# Patient Record
Sex: Female | Born: 1935 | Race: Black or African American | Hispanic: No | Smoking: Never smoker
Health system: Southern US, Community
[De-identification: ages and names within clinical notes are randomized; demographics above are authoritative.]

## PROBLEM LIST (undated history)

## (undated) DIAGNOSIS — M1990 Unspecified osteoarthritis, unspecified site: Secondary | ICD-10-CM

## (undated) DIAGNOSIS — I10 Essential (primary) hypertension: Secondary | ICD-10-CM

## (undated) DIAGNOSIS — F329 Major depressive disorder, single episode, unspecified: Secondary | ICD-10-CM

## (undated) DIAGNOSIS — F419 Anxiety disorder, unspecified: Secondary | ICD-10-CM

## (undated) DIAGNOSIS — F32A Depression, unspecified: Secondary | ICD-10-CM

## (undated) HISTORY — DX: Unspecified osteoarthritis, unspecified site: M19.90

## (undated) HISTORY — DX: Essential (primary) hypertension: I10

## (undated) HISTORY — DX: Major depressive disorder, single episode, unspecified: F32.9

## (undated) HISTORY — DX: Depression, unspecified: F32.A

## (undated) HISTORY — DX: Anxiety disorder, unspecified: F41.9

---

## 1999-08-29 ENCOUNTER — Other Ambulatory Visit: Admission: RE | Admit: 1999-08-29 | Discharge: 1999-08-29 | Payer: Self-pay | Admitting: Family Medicine

## 2001-06-18 ENCOUNTER — Ambulatory Visit (HOSPITAL_COMMUNITY): Admission: RE | Admit: 2001-06-18 | Discharge: 2001-06-18 | Payer: Self-pay | Admitting: Family Medicine

## 2001-06-18 ENCOUNTER — Encounter: Payer: Self-pay | Admitting: Family Medicine

## 2002-06-29 ENCOUNTER — Ambulatory Visit (HOSPITAL_COMMUNITY): Admission: RE | Admit: 2002-06-29 | Discharge: 2002-06-29 | Payer: Self-pay | Admitting: Family Medicine

## 2002-06-29 ENCOUNTER — Encounter: Payer: Self-pay | Admitting: Family Medicine

## 2003-08-16 ENCOUNTER — Encounter (HOSPITAL_COMMUNITY): Admission: RE | Admit: 2003-08-16 | Discharge: 2003-09-15 | Payer: Self-pay | Admitting: Family Medicine

## 2005-12-05 ENCOUNTER — Emergency Department (HOSPITAL_COMMUNITY): Admission: EM | Admit: 2005-12-05 | Discharge: 2005-12-05 | Payer: Self-pay | Admitting: Emergency Medicine

## 2006-12-08 ENCOUNTER — Ambulatory Visit (HOSPITAL_COMMUNITY): Admission: RE | Admit: 2006-12-08 | Discharge: 2006-12-08 | Payer: Self-pay | Admitting: Family Medicine

## 2009-04-04 ENCOUNTER — Encounter: Admission: RE | Admit: 2009-04-04 | Discharge: 2009-04-04 | Payer: Self-pay | Admitting: General Surgery

## 2009-04-09 ENCOUNTER — Ambulatory Visit (HOSPITAL_BASED_OUTPATIENT_CLINIC_OR_DEPARTMENT_OTHER): Admission: RE | Admit: 2009-04-09 | Discharge: 2009-04-09 | Payer: Self-pay | Admitting: General Surgery

## 2010-05-19 ENCOUNTER — Encounter: Payer: Self-pay | Admitting: Family Medicine

## 2010-07-30 LAB — CBC
HCT: 38.2 % (ref 36.0–46.0)
Hemoglobin: 12.9 g/dL (ref 12.0–15.0)
MCV: 94.8 fL (ref 78.0–100.0)
RBC: 4.03 MIL/uL (ref 3.87–5.11)
WBC: 10.6 10*3/uL — ABNORMAL HIGH (ref 4.0–10.5)

## 2010-07-30 LAB — DIFFERENTIAL
Eosinophils Absolute: 0.6 10*3/uL (ref 0.0–0.7)
Eosinophils Relative: 6 % — ABNORMAL HIGH (ref 0–5)
Lymphocytes Relative: 34 % (ref 12–46)
Lymphs Abs: 3.6 10*3/uL (ref 0.7–4.0)
Monocytes Relative: 7 % (ref 3–12)

## 2010-07-30 LAB — BASIC METABOLIC PANEL
BUN: 9 mg/dL (ref 6–23)
CO2: 32 mEq/L (ref 19–32)
Calcium: 9.3 mg/dL (ref 8.4–10.5)
Chloride: 104 mEq/L (ref 96–112)
Creatinine, Ser: 1.03 mg/dL (ref 0.4–1.2)
GFR calc Af Amer: >60 mL/min (ref >60)
GFR calc non Af Amer: 53 mL/min — ABNORMAL LOW (ref >60)
Glucose, Bld: 94 mg/dL (ref 70–99)
Potassium: 3.3 mEq/L — ABNORMAL LOW (ref 3.5–5.1)
Sodium: 142 mEq/L (ref 135–145)

## 2010-07-30 LAB — PROTIME-INR: Prothrombin Time: 13.2 seconds (ref 11.6–15.2)

## 2011-06-02 DIAGNOSIS — I10 Essential (primary) hypertension: Secondary | ICD-10-CM | POA: Diagnosis not present

## 2011-06-02 DIAGNOSIS — E781 Pure hyperglyceridemia: Secondary | ICD-10-CM | POA: Diagnosis not present

## 2011-06-18 DIAGNOSIS — F332 Major depressive disorder, recurrent severe without psychotic features: Secondary | ICD-10-CM | POA: Diagnosis not present

## 2011-09-29 DIAGNOSIS — N318 Other neuromuscular dysfunction of bladder: Secondary | ICD-10-CM | POA: Diagnosis not present

## 2011-09-29 DIAGNOSIS — I10 Essential (primary) hypertension: Secondary | ICD-10-CM | POA: Diagnosis not present

## 2011-09-29 DIAGNOSIS — F29 Unspecified psychosis not due to a substance or known physiological condition: Secondary | ICD-10-CM | POA: Diagnosis not present

## 2012-02-02 DIAGNOSIS — Z23 Encounter for immunization: Secondary | ICD-10-CM | POA: Diagnosis not present

## 2012-02-02 DIAGNOSIS — F312 Bipolar disorder, current episode manic severe with psychotic features: Secondary | ICD-10-CM | POA: Diagnosis not present

## 2012-02-02 DIAGNOSIS — I10 Essential (primary) hypertension: Secondary | ICD-10-CM | POA: Diagnosis not present

## 2012-04-19 DIAGNOSIS — F332 Major depressive disorder, recurrent severe without psychotic features: Secondary | ICD-10-CM | POA: Diagnosis not present

## 2012-05-03 DIAGNOSIS — F29 Unspecified psychosis not due to a substance or known physiological condition: Secondary | ICD-10-CM | POA: Diagnosis not present

## 2012-05-03 DIAGNOSIS — I10 Essential (primary) hypertension: Secondary | ICD-10-CM | POA: Diagnosis not present

## 2012-05-03 DIAGNOSIS — F329 Major depressive disorder, single episode, unspecified: Secondary | ICD-10-CM | POA: Diagnosis not present

## 2012-05-03 DIAGNOSIS — F411 Generalized anxiety disorder: Secondary | ICD-10-CM | POA: Diagnosis not present

## 2012-06-15 DIAGNOSIS — F332 Major depressive disorder, recurrent severe without psychotic features: Secondary | ICD-10-CM | POA: Diagnosis not present

## 2012-09-07 DIAGNOSIS — F332 Major depressive disorder, recurrent severe without psychotic features: Secondary | ICD-10-CM | POA: Diagnosis not present

## 2012-09-17 DIAGNOSIS — R21 Rash and other nonspecific skin eruption: Secondary | ICD-10-CM | POA: Diagnosis not present

## 2012-09-17 DIAGNOSIS — I10 Essential (primary) hypertension: Secondary | ICD-10-CM | POA: Diagnosis not present

## 2012-09-17 DIAGNOSIS — E7800 Pure hypercholesterolemia, unspecified: Secondary | ICD-10-CM | POA: Diagnosis not present

## 2012-10-01 ENCOUNTER — Ambulatory Visit (HOSPITAL_COMMUNITY)
Admission: RE | Admit: 2012-10-01 | Discharge: 2012-10-01 | Disposition: A | Discharge status: Home / Self Care | Payer: Medicare Other | Source: Ambulatory Visit | Attending: Family Medicine | Admitting: Family Medicine

## 2012-10-01 ENCOUNTER — Other Ambulatory Visit (HOSPITAL_COMMUNITY): Payer: Self-pay | Admitting: Family Medicine

## 2012-10-01 DIAGNOSIS — M79609 Pain in unspecified limb: Secondary | ICD-10-CM | POA: Diagnosis not present

## 2012-10-01 DIAGNOSIS — M25559 Pain in unspecified hip: Secondary | ICD-10-CM | POA: Diagnosis not present

## 2012-10-01 DIAGNOSIS — S79919A Unspecified injury of unspecified hip, initial encounter: Secondary | ICD-10-CM | POA: Diagnosis not present

## 2012-10-01 DIAGNOSIS — T148XXA Other injury of unspecified body region, initial encounter: Secondary | ICD-10-CM

## 2012-10-01 DIAGNOSIS — S79929A Unspecified injury of unspecified thigh, initial encounter: Secondary | ICD-10-CM | POA: Diagnosis not present

## 2012-12-20 DIAGNOSIS — I10 Essential (primary) hypertension: Secondary | ICD-10-CM | POA: Diagnosis not present

## 2013-02-22 DIAGNOSIS — F339 Major depressive disorder, recurrent, unspecified: Secondary | ICD-10-CM | POA: Diagnosis not present

## 2013-04-25 DIAGNOSIS — I10 Essential (primary) hypertension: Secondary | ICD-10-CM | POA: Diagnosis not present

## 2013-04-25 DIAGNOSIS — F323 Major depressive disorder, single episode, severe with psychotic features: Secondary | ICD-10-CM | POA: Diagnosis not present

## 2013-05-17 DIAGNOSIS — F339 Major depressive disorder, recurrent, unspecified: Secondary | ICD-10-CM | POA: Diagnosis not present

## 2013-05-27 DIAGNOSIS — E119 Type 2 diabetes mellitus without complications: Secondary | ICD-10-CM | POA: Diagnosis not present

## 2013-05-27 DIAGNOSIS — I10 Essential (primary) hypertension: Secondary | ICD-10-CM | POA: Diagnosis not present

## 2013-05-27 DIAGNOSIS — E7800 Pure hypercholesterolemia, unspecified: Secondary | ICD-10-CM | POA: Diagnosis not present

## 2013-08-08 DIAGNOSIS — F339 Major depressive disorder, recurrent, unspecified: Secondary | ICD-10-CM | POA: Diagnosis not present

## 2013-08-09 DIAGNOSIS — H43399 Other vitreous opacities, unspecified eye: Secondary | ICD-10-CM | POA: Diagnosis not present

## 2013-08-09 DIAGNOSIS — H2510 Age-related nuclear cataract, unspecified eye: Secondary | ICD-10-CM | POA: Diagnosis not present

## 2013-08-23 DIAGNOSIS — H2589 Other age-related cataract: Secondary | ICD-10-CM | POA: Diagnosis not present

## 2013-08-29 DIAGNOSIS — H2589 Other age-related cataract: Secondary | ICD-10-CM | POA: Diagnosis not present

## 2013-08-29 DIAGNOSIS — H2510 Age-related nuclear cataract, unspecified eye: Secondary | ICD-10-CM | POA: Diagnosis not present

## 2013-08-29 DIAGNOSIS — H269 Unspecified cataract: Secondary | ICD-10-CM | POA: Diagnosis not present

## 2013-09-12 DIAGNOSIS — H2589 Other age-related cataract: Secondary | ICD-10-CM | POA: Diagnosis not present

## 2013-09-12 DIAGNOSIS — H2510 Age-related nuclear cataract, unspecified eye: Secondary | ICD-10-CM | POA: Diagnosis not present

## 2013-09-12 DIAGNOSIS — H269 Unspecified cataract: Secondary | ICD-10-CM | POA: Diagnosis not present

## 2013-09-27 DIAGNOSIS — F323 Major depressive disorder, single episode, severe with psychotic features: Secondary | ICD-10-CM | POA: Diagnosis not present

## 2013-09-27 DIAGNOSIS — I10 Essential (primary) hypertension: Secondary | ICD-10-CM | POA: Diagnosis not present

## 2013-09-27 DIAGNOSIS — F32A Depression, unspecified: Secondary | ICD-10-CM | POA: Diagnosis not present

## 2013-09-27 DIAGNOSIS — B1920 Unspecified viral hepatitis C without hepatic coma: Secondary | ICD-10-CM | POA: Diagnosis not present

## 2013-09-27 DIAGNOSIS — E7800 Pure hypercholesterolemia, unspecified: Secondary | ICD-10-CM | POA: Diagnosis not present

## 2013-10-31 ENCOUNTER — Other Ambulatory Visit: Payer: Self-pay | Admitting: Nurse Practitioner

## 2013-10-31 DIAGNOSIS — C220 Liver cell carcinoma: Secondary | ICD-10-CM

## 2013-10-31 DIAGNOSIS — B182 Chronic viral hepatitis C: Secondary | ICD-10-CM | POA: Diagnosis not present

## 2013-11-01 DIAGNOSIS — F339 Major depressive disorder, recurrent, unspecified: Secondary | ICD-10-CM | POA: Diagnosis not present

## 2013-11-03 DIAGNOSIS — B182 Chronic viral hepatitis C: Secondary | ICD-10-CM | POA: Diagnosis not present

## 2013-11-09 ENCOUNTER — Other Ambulatory Visit: Payer: Medicare Other

## 2013-11-11 ENCOUNTER — Ambulatory Visit
Admission: RE | Admit: 2013-11-11 | Discharge: 2013-11-11 | Disposition: A | Discharge status: Home / Self Care | Payer: Medicare Other | Source: Ambulatory Visit | Attending: Nurse Practitioner | Admitting: Nurse Practitioner

## 2013-11-11 DIAGNOSIS — C220 Liver cell carcinoma: Secondary | ICD-10-CM

## 2013-11-11 DIAGNOSIS — N281 Cyst of kidney, acquired: Secondary | ICD-10-CM | POA: Diagnosis not present

## 2013-11-11 DIAGNOSIS — K8020 Calculus of gallbladder without cholecystitis without obstruction: Secondary | ICD-10-CM | POA: Diagnosis not present

## 2013-12-07 DIAGNOSIS — F29 Unspecified psychosis not due to a substance or known physiological condition: Secondary | ICD-10-CM | POA: Diagnosis not present

## 2013-12-07 DIAGNOSIS — I10 Essential (primary) hypertension: Secondary | ICD-10-CM | POA: Diagnosis not present

## 2013-12-07 DIAGNOSIS — B1920 Unspecified viral hepatitis C without hepatic coma: Secondary | ICD-10-CM | POA: Diagnosis not present

## 2013-12-28 DIAGNOSIS — R109 Unspecified abdominal pain: Secondary | ICD-10-CM | POA: Diagnosis not present

## 2014-02-15 IMAGING — CR DG FEMUR 2V*L*
4 series · 4 of 4 positions shown · non-contrast
Comparison: None.

CLINICAL DATA: Left thigh pain for 2 weeks.  Denies injury.

LEFT FEMUR - 2 VIEW

[view not recorded (1 of 4)]
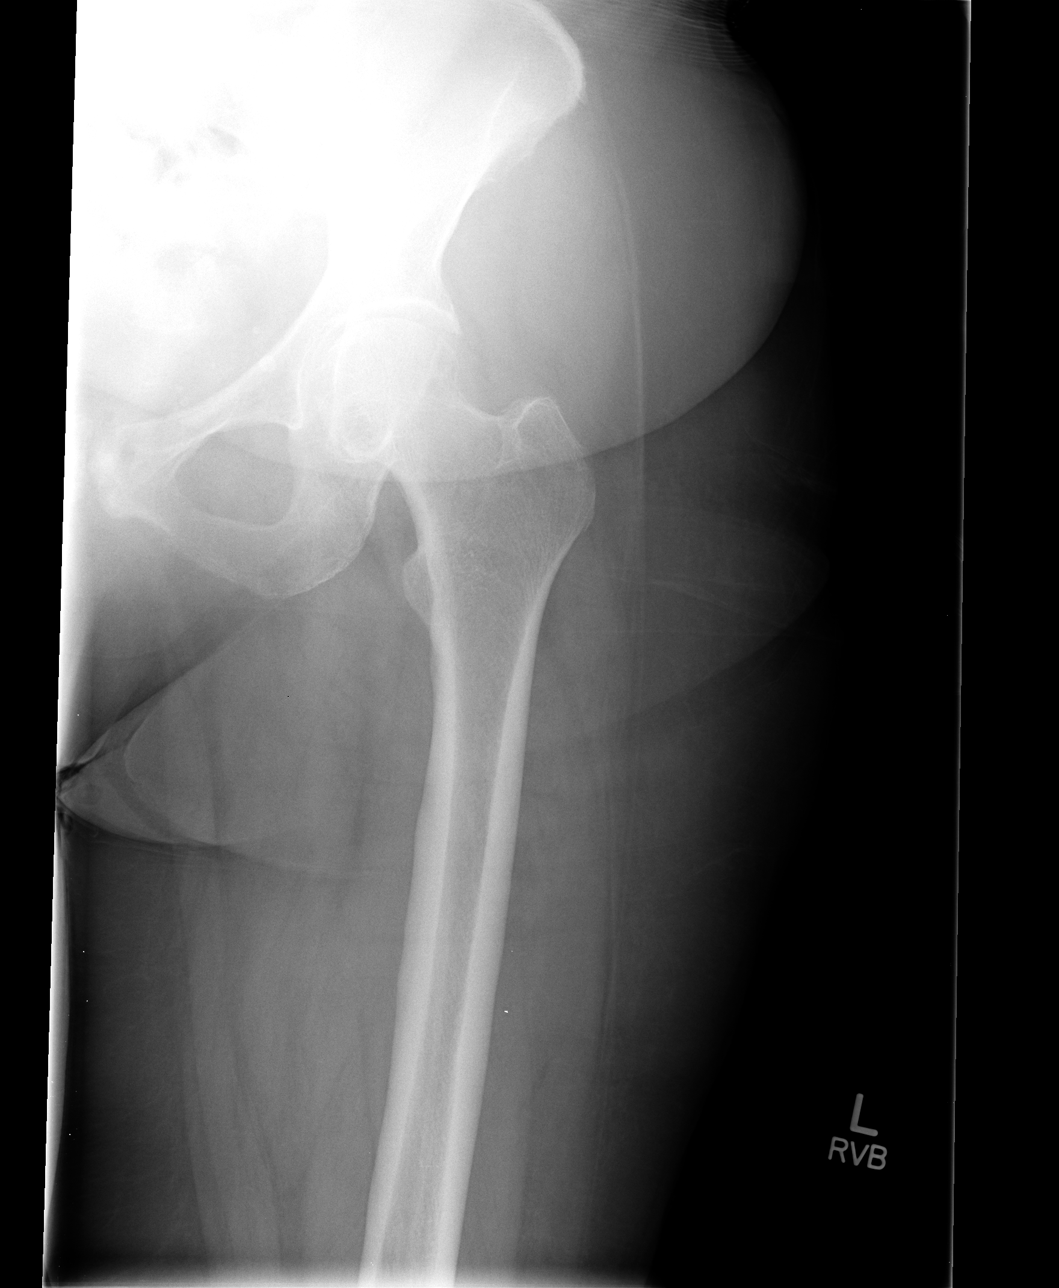

[view not recorded (2 of 4)]
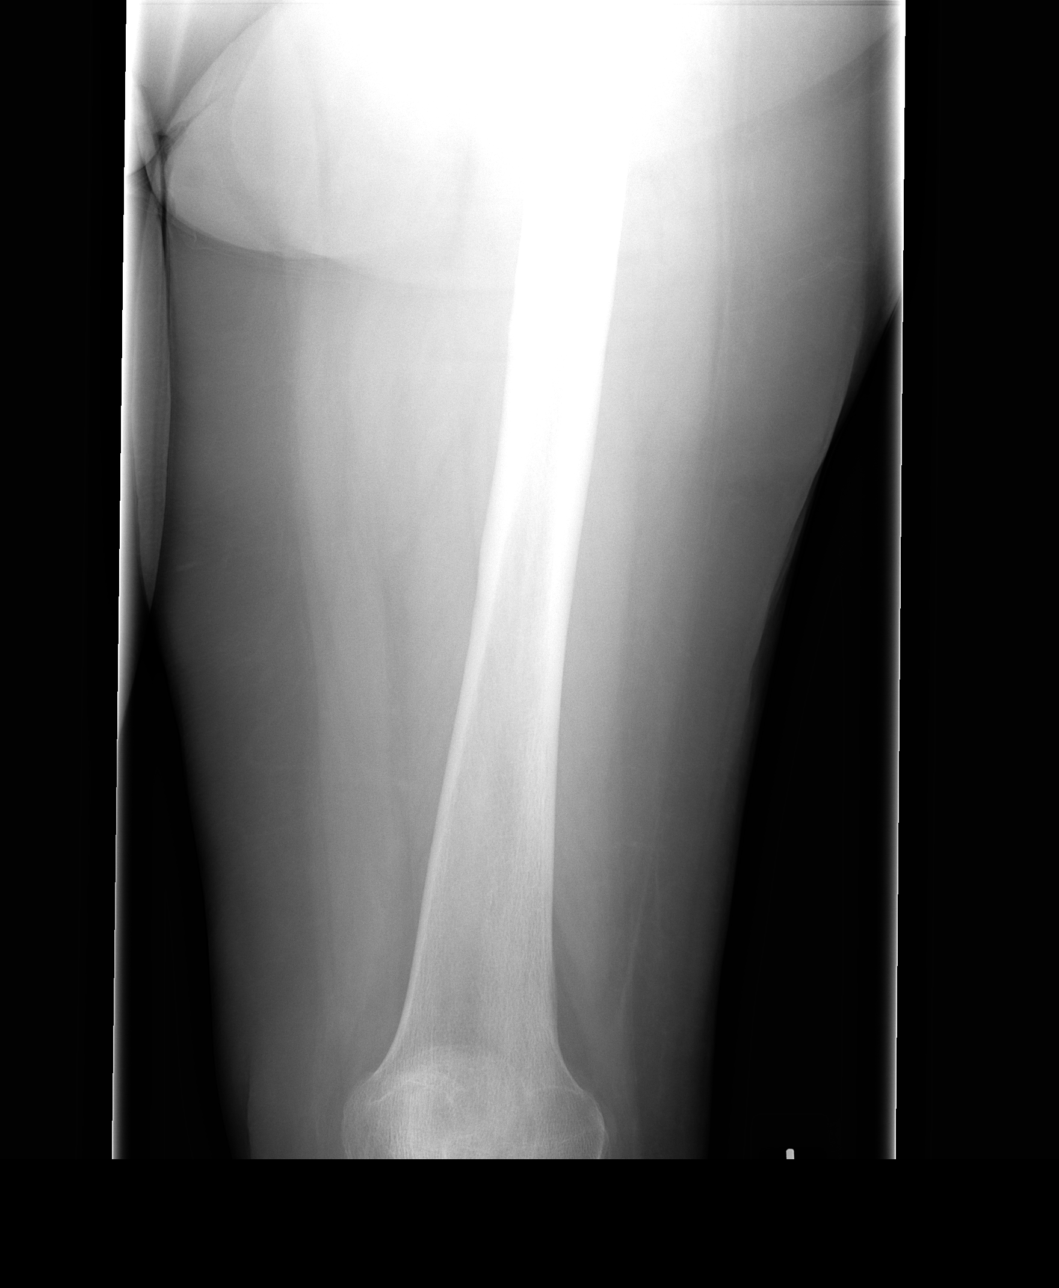

[view not recorded (3 of 4)]
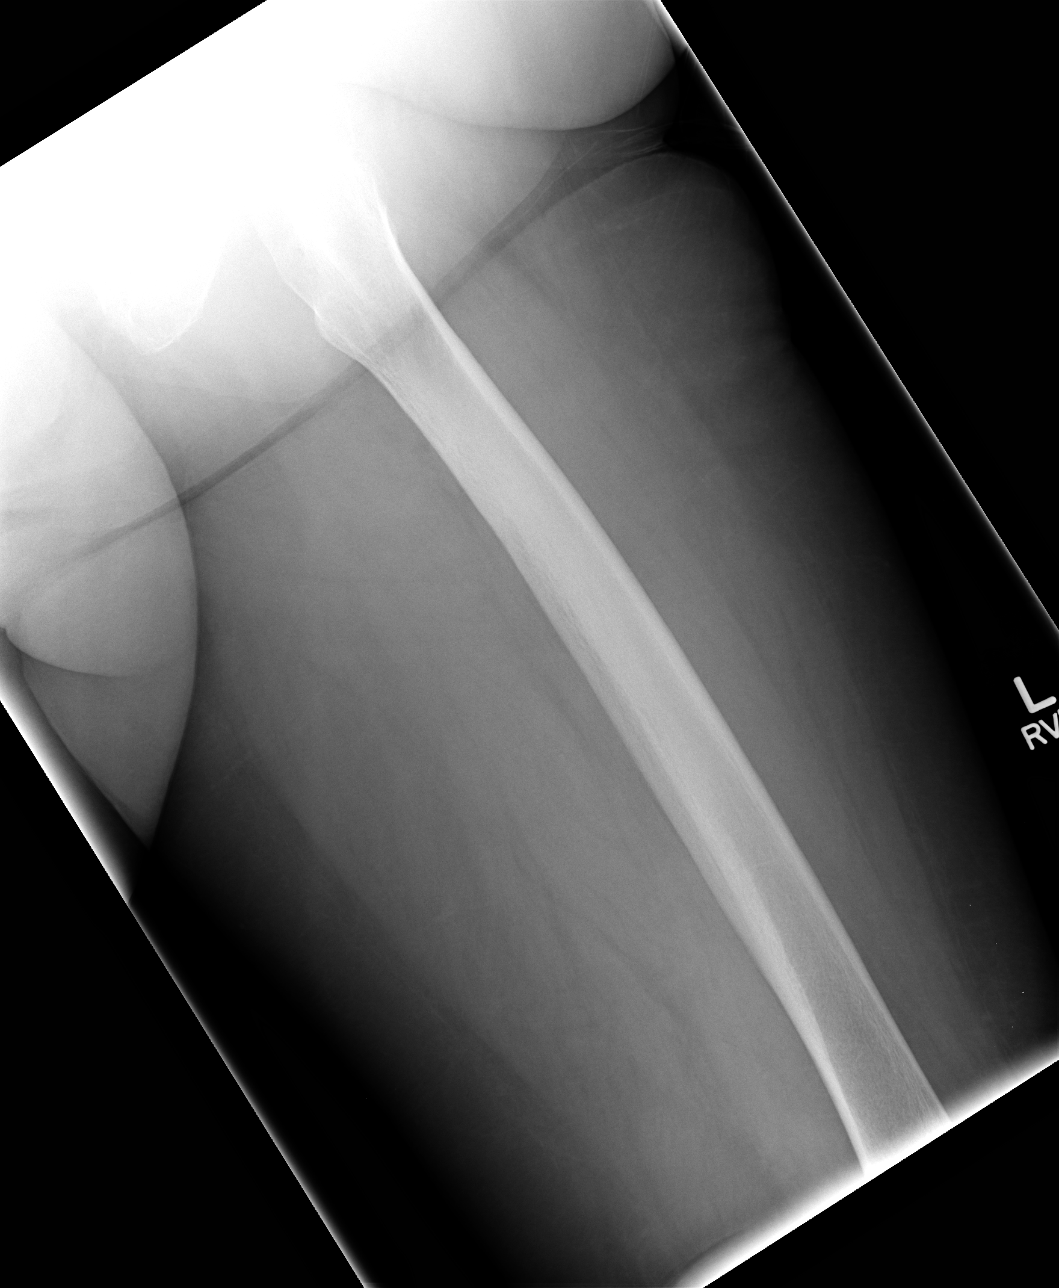

[view not recorded (4 of 4)]
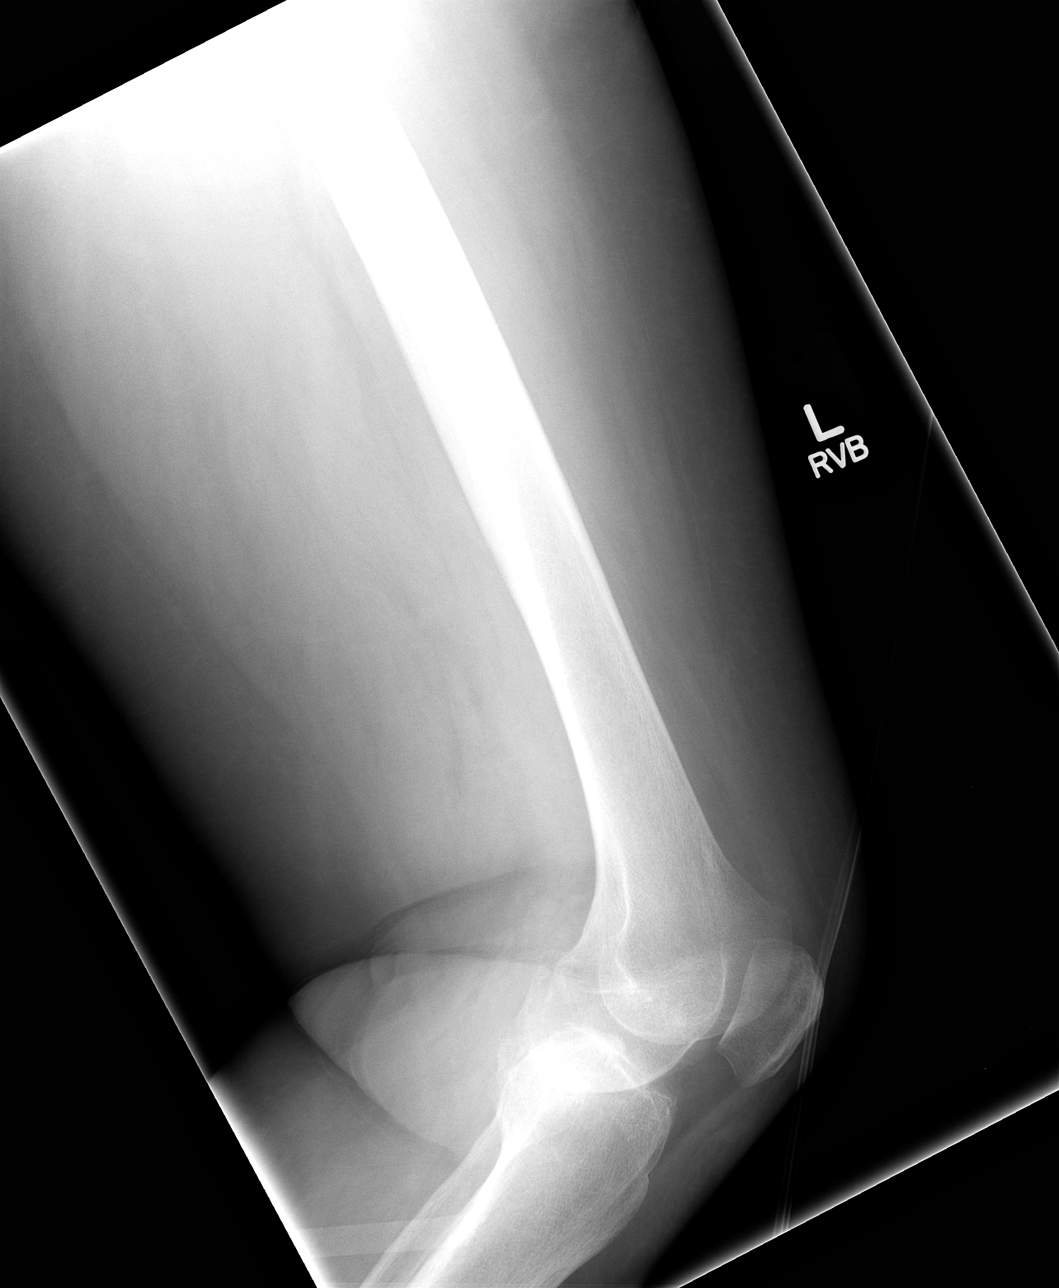

[4 of 4 positions shown; findings below may reference images not displayed]

FINDINGS: Mild left sacroiliac joint degenerative changes.

Moderate left medial tibiofemoral joint degenerative changes.

Otherwise negative exam.
IMPRESSION: Mild left sacroiliac joint degenerative changes.

Moderate left medial tibiofemoral joint degenerative changes.

Otherwise negative exam.]

## 2014-03-01 DIAGNOSIS — F339 Major depressive disorder, recurrent, unspecified: Secondary | ICD-10-CM | POA: Diagnosis not present

## 2014-04-14 DIAGNOSIS — F064 Anxiety disorder due to known physiological condition: Secondary | ICD-10-CM | POA: Diagnosis not present

## 2014-04-14 DIAGNOSIS — I10 Essential (primary) hypertension: Secondary | ICD-10-CM | POA: Diagnosis not present

## 2014-04-14 DIAGNOSIS — R2681 Unsteadiness on feet: Secondary | ICD-10-CM | POA: Diagnosis not present

## 2014-04-14 DIAGNOSIS — F250 Schizoaffective disorder, bipolar type: Secondary | ICD-10-CM | POA: Diagnosis not present

## 2014-04-19 DIAGNOSIS — B182 Chronic viral hepatitis C: Secondary | ICD-10-CM | POA: Diagnosis not present

## 2014-05-25 DIAGNOSIS — F339 Major depressive disorder, recurrent, unspecified: Secondary | ICD-10-CM | POA: Diagnosis not present

## 2014-08-15 DIAGNOSIS — F339 Major depressive disorder, recurrent, unspecified: Secondary | ICD-10-CM | POA: Diagnosis not present

## 2014-09-05 DIAGNOSIS — R130 Aphagia: Secondary | ICD-10-CM | POA: Diagnosis not present

## 2014-09-05 DIAGNOSIS — F250 Schizoaffective disorder, bipolar type: Secondary | ICD-10-CM | POA: Diagnosis not present

## 2014-09-05 DIAGNOSIS — I10 Essential (primary) hypertension: Secondary | ICD-10-CM | POA: Diagnosis not present

## 2014-09-05 DIAGNOSIS — B182 Chronic viral hepatitis C: Secondary | ICD-10-CM | POA: Diagnosis not present

## 2014-09-07 ENCOUNTER — Other Ambulatory Visit (HOSPITAL_COMMUNITY): Payer: Self-pay | Admitting: Family Medicine

## 2014-09-07 ENCOUNTER — Telehealth: Payer: Self-pay | Admitting: *Deleted

## 2014-09-07 DIAGNOSIS — R1310 Dysphagia, unspecified: Secondary | ICD-10-CM

## 2014-09-07 NOTE — Telephone Encounter (Signed)
PCP notified; patient does not comprehend referral for Hep C. Office to notify daughter and have her return my call. Patient needs to be scheduled for lab appt and then an office visit with Dr. Linus Salmons in June. Myrtis Hopping

## 2014-09-12 ENCOUNTER — Other Ambulatory Visit (HOSPITAL_COMMUNITY): Payer: Medicare Other

## 2014-09-15 ENCOUNTER — Other Ambulatory Visit: Payer: Medicare Other

## 2014-09-15 DIAGNOSIS — B182 Chronic viral hepatitis C: Secondary | ICD-10-CM | POA: Diagnosis not present

## 2014-09-15 LAB — IRON: Iron: 74 ug/dL (ref 42–145)

## 2014-09-15 LAB — HEPATITIS B SURFACE ANTIBODY,QUALITATIVE: HEP B S AB: NEGATIVE

## 2014-09-15 LAB — HEPATITIS B CORE ANTIBODY, TOTAL: HEP B C TOTAL AB: NONREACTIVE

## 2014-09-15 LAB — HEPATITIS A ANTIBODY, TOTAL: HEP A TOTAL AB: NONREACTIVE

## 2014-09-15 LAB — HEPATITIS B SURFACE ANTIGEN: Hepatitis B Surface Ag: NEGATIVE

## 2014-09-16 LAB — PROTIME-INR
INR: 1.09 (ref <1.50)
Prothrombin Time: 14.1 seconds (ref 11.6–15.2)

## 2014-09-16 LAB — HIV ANTIBODY (ROUTINE TESTING W REFLEX): HIV: NONREACTIVE

## 2014-09-18 LAB — HEPATITIS C RNA QUANTITATIVE
HCV QUANT: 7791416 [IU]/mL — AB (ref <15)
HCV Quantitative Log: 6.89 {Log} — ABNORMAL HIGH (ref <1.18)

## 2014-09-18 LAB — ANA: Anti Nuclear Antibody(ANA): NEGATIVE

## 2014-09-20 LAB — HEPATITIS C GENOTYPE: HCV Genotype: 1

## 2014-10-06 ENCOUNTER — Encounter: Payer: Medicare Other | Admitting: Internal Medicine

## 2014-10-10 DIAGNOSIS — M545 Low back pain: Secondary | ICD-10-CM | POA: Diagnosis not present

## 2014-10-10 DIAGNOSIS — R2681 Unsteadiness on feet: Secondary | ICD-10-CM | POA: Diagnosis not present

## 2014-10-10 DIAGNOSIS — M6281 Muscle weakness (generalized): Secondary | ICD-10-CM | POA: Diagnosis not present

## 2014-10-10 DIAGNOSIS — M25561 Pain in right knee: Secondary | ICD-10-CM | POA: Diagnosis not present

## 2014-10-11 DIAGNOSIS — M545 Low back pain: Secondary | ICD-10-CM | POA: Diagnosis not present

## 2014-10-11 DIAGNOSIS — M25561 Pain in right knee: Secondary | ICD-10-CM | POA: Diagnosis not present

## 2014-10-11 DIAGNOSIS — R2681 Unsteadiness on feet: Secondary | ICD-10-CM | POA: Diagnosis not present

## 2014-10-11 DIAGNOSIS — M6281 Muscle weakness (generalized): Secondary | ICD-10-CM | POA: Diagnosis not present

## 2014-10-16 DIAGNOSIS — M545 Low back pain: Secondary | ICD-10-CM | POA: Diagnosis not present

## 2014-10-16 DIAGNOSIS — R2681 Unsteadiness on feet: Secondary | ICD-10-CM | POA: Diagnosis not present

## 2014-10-16 DIAGNOSIS — M25561 Pain in right knee: Secondary | ICD-10-CM | POA: Diagnosis not present

## 2014-10-16 DIAGNOSIS — M6281 Muscle weakness (generalized): Secondary | ICD-10-CM | POA: Diagnosis not present

## 2014-10-18 DIAGNOSIS — M6281 Muscle weakness (generalized): Secondary | ICD-10-CM | POA: Diagnosis not present

## 2014-10-18 DIAGNOSIS — M545 Low back pain: Secondary | ICD-10-CM | POA: Diagnosis not present

## 2014-10-18 DIAGNOSIS — R2681 Unsteadiness on feet: Secondary | ICD-10-CM | POA: Diagnosis not present

## 2014-10-18 DIAGNOSIS — M25561 Pain in right knee: Secondary | ICD-10-CM | POA: Diagnosis not present

## 2014-10-23 DIAGNOSIS — M25561 Pain in right knee: Secondary | ICD-10-CM | POA: Diagnosis not present

## 2014-10-23 DIAGNOSIS — M6281 Muscle weakness (generalized): Secondary | ICD-10-CM | POA: Diagnosis not present

## 2014-10-23 DIAGNOSIS — R2681 Unsteadiness on feet: Secondary | ICD-10-CM | POA: Diagnosis not present

## 2014-10-23 DIAGNOSIS — M545 Low back pain: Secondary | ICD-10-CM | POA: Diagnosis not present

## 2014-10-25 DIAGNOSIS — M6281 Muscle weakness (generalized): Secondary | ICD-10-CM | POA: Diagnosis not present

## 2014-10-25 DIAGNOSIS — M545 Low back pain: Secondary | ICD-10-CM | POA: Diagnosis not present

## 2014-10-25 DIAGNOSIS — R2681 Unsteadiness on feet: Secondary | ICD-10-CM | POA: Diagnosis not present

## 2014-10-25 DIAGNOSIS — M25561 Pain in right knee: Secondary | ICD-10-CM | POA: Diagnosis not present

## 2014-11-01 DIAGNOSIS — R2681 Unsteadiness on feet: Secondary | ICD-10-CM | POA: Diagnosis not present

## 2014-11-01 DIAGNOSIS — M25561 Pain in right knee: Secondary | ICD-10-CM | POA: Diagnosis not present

## 2014-11-01 DIAGNOSIS — M545 Low back pain: Secondary | ICD-10-CM | POA: Diagnosis not present

## 2014-11-01 DIAGNOSIS — M6281 Muscle weakness (generalized): Secondary | ICD-10-CM | POA: Diagnosis not present

## 2014-12-05 DIAGNOSIS — F339 Major depressive disorder, recurrent, unspecified: Secondary | ICD-10-CM | POA: Diagnosis not present

## 2014-12-06 ENCOUNTER — Encounter: Payer: Medicare Other | Admitting: Internal Medicine

## 2015-01-02 NOTE — Telephone Encounter (Signed)
Pt (has dementia)  has had No show appmt with Dr Linus Salmons on 10/06/14 and pt's family member (daughter?) called to  reschedule appmt day of , 12/06/14-Tree fell on house. In trying to reach the pt's daughter 12/14/14 -NA at both numbers.  01/02/15-Busy x2-informed PCP's office. Pt is inactive until I hear from someone.

## 2015-02-19 DIAGNOSIS — D649 Anemia, unspecified: Secondary | ICD-10-CM | POA: Diagnosis not present

## 2015-02-19 DIAGNOSIS — B182 Chronic viral hepatitis C: Secondary | ICD-10-CM | POA: Diagnosis not present

## 2015-02-19 DIAGNOSIS — I10 Essential (primary) hypertension: Secondary | ICD-10-CM | POA: Diagnosis not present

## 2015-02-19 DIAGNOSIS — Z23 Encounter for immunization: Secondary | ICD-10-CM | POA: Diagnosis not present

## 2015-02-19 DIAGNOSIS — F250 Schizoaffective disorder, bipolar type: Secondary | ICD-10-CM | POA: Diagnosis not present

## 2015-03-28 DIAGNOSIS — F339 Major depressive disorder, recurrent, unspecified: Secondary | ICD-10-CM | POA: Diagnosis not present

## 2015-03-28 IMAGING — US US ABDOMEN COMPLETE
1 series · 13 of 25 positions shown · non-contrast
Comparison: None.

CLINICAL DATA: Hepatitis-C.  Evaluate for hepatoma.

EXAM:
ULTRASOUND ABDOMEN COMPLETE

[Series 1: us abdomen complete · 0.32mm/px · 13 of 94 slices shown]
[im 1/94]
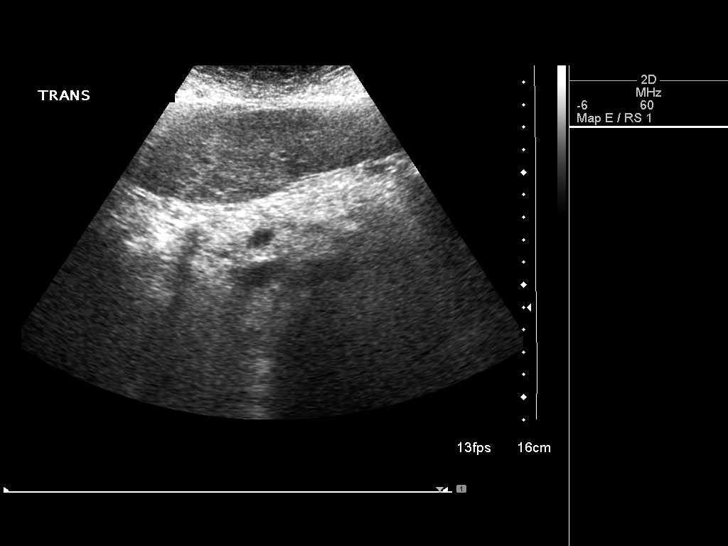
[im 8/94]
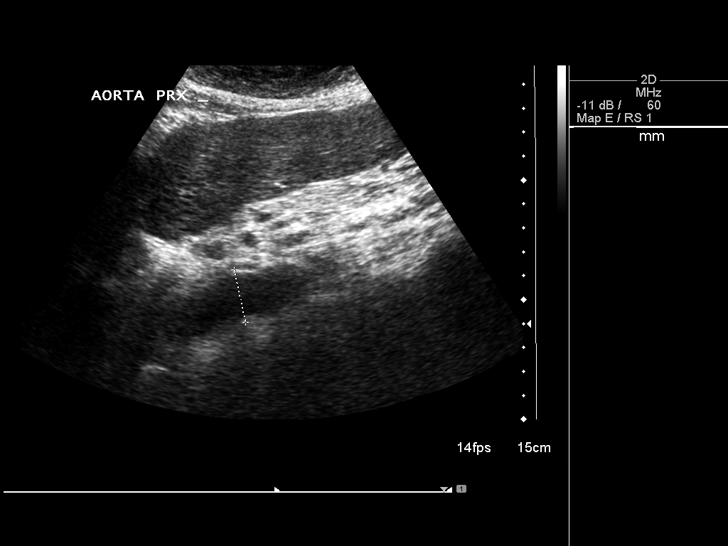
[im 16/94]
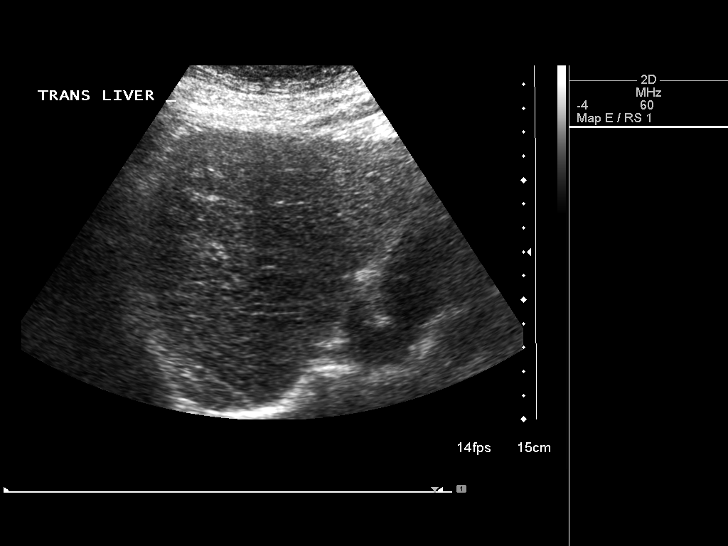
[im 24/94]
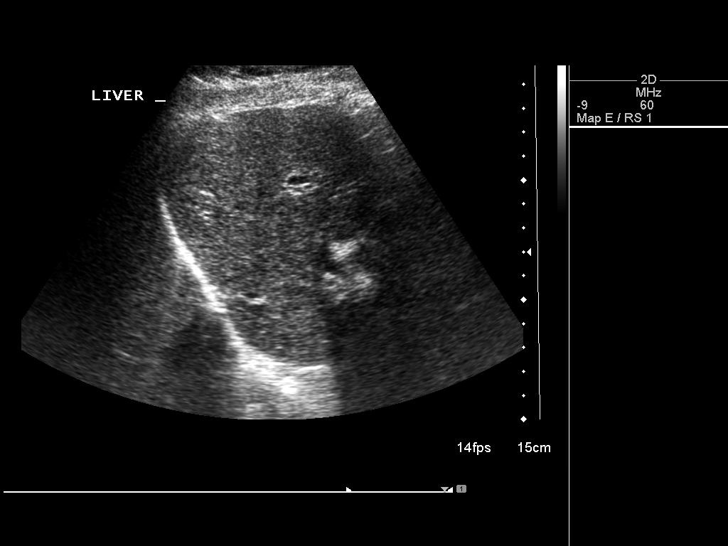
[im 32/94]
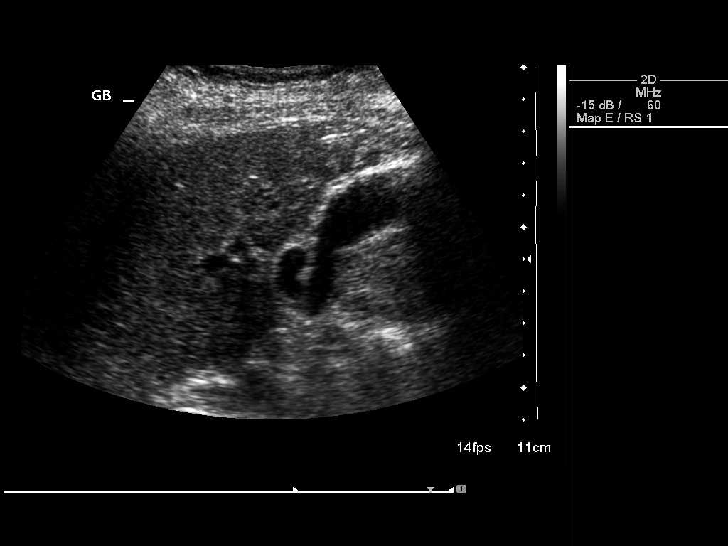
[im 39/94]
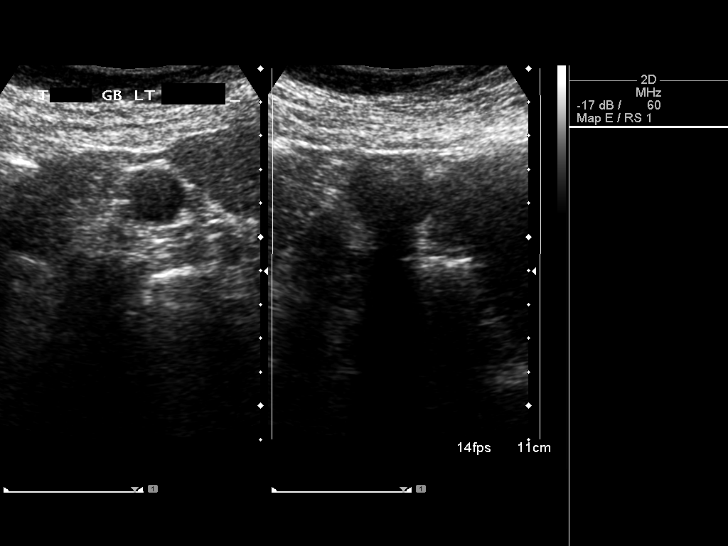
[im 47/94]
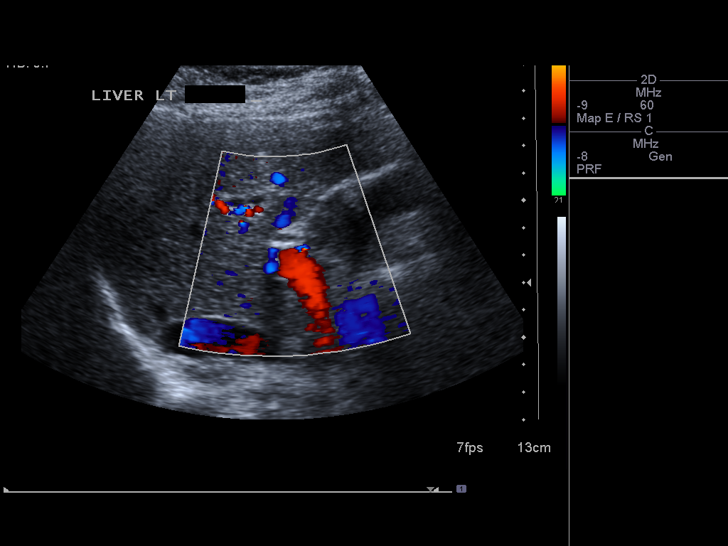
[im 55/94]
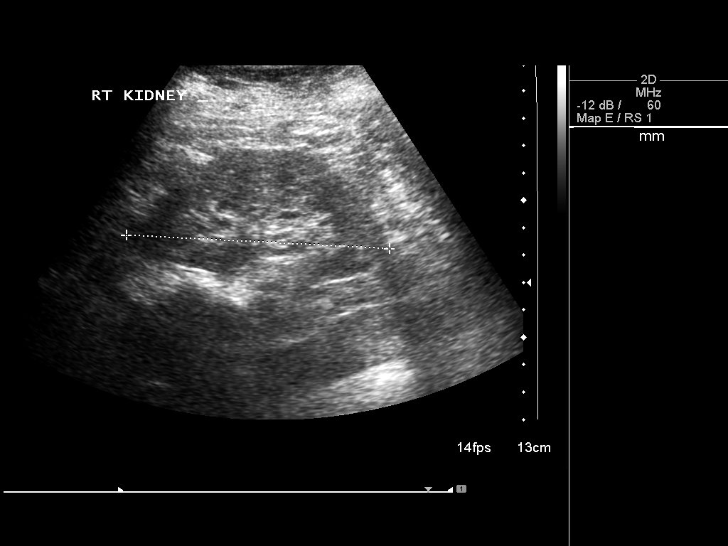
[im 63/94]
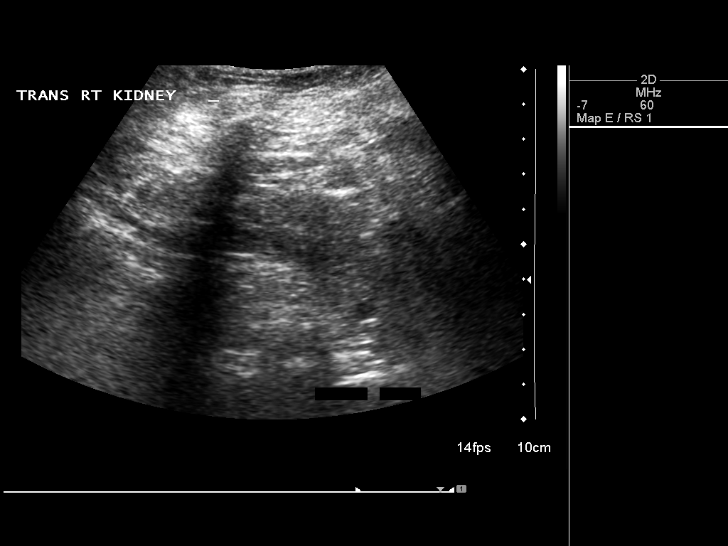
[im 70/94]
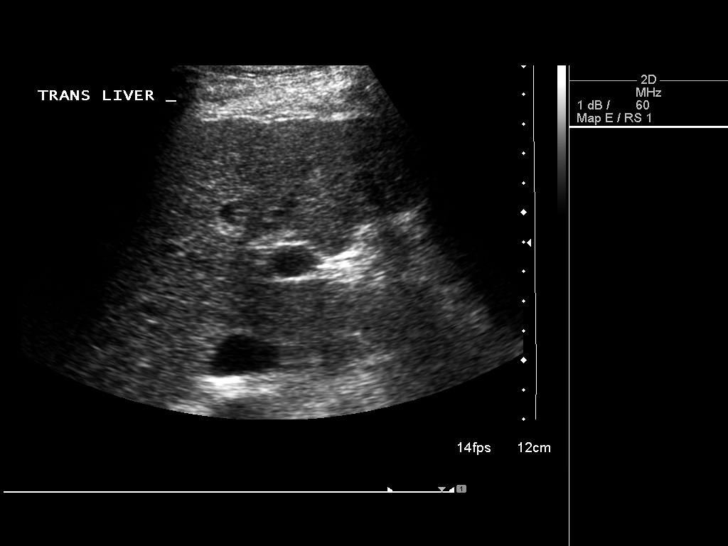
[im 78/94]
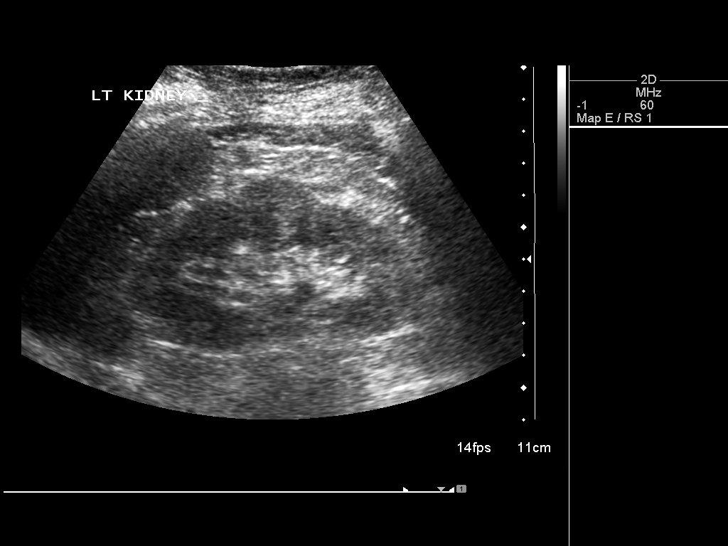
[im 86/94]
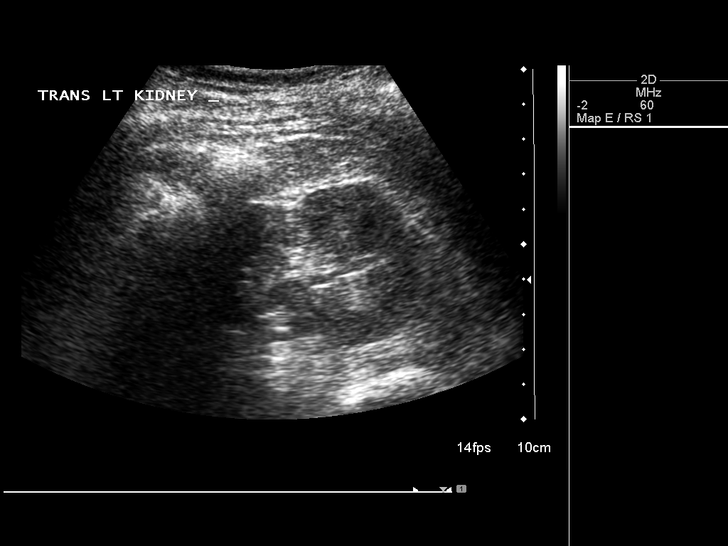
[im 94/94]
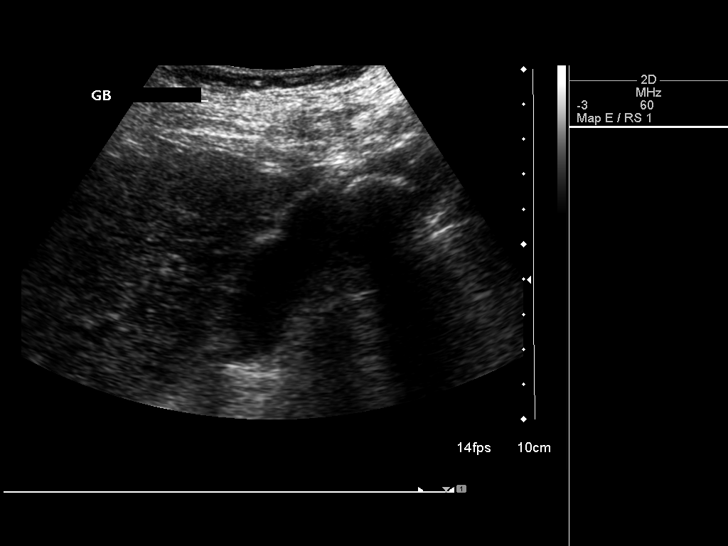

[13 of 25 positions shown; findings below may reference images not displayed]

FINDINGS: Gallbladder:

There are 2 large stones. No wall thickening or pericholecystic
fluid. No evidence of acute cholecystitis.

Common bile duct:

Diameter: 3.2 mm

Liver:

Coarsened echotexture and mildly echogenic. Mildly nodular contour.
Findings suggest cirrhosis with a possible component of fatty
infiltration. No liver mass or focal lesion. Hepatopetal flow was
documented in the portal vein.

IVC:

No abnormality visualized.

Pancreas:

Visualized portion unremarkable.

Spleen:

Size and appearance within normal limits.

Right Kidney:

Length: 9.6 cm. Normal parenchymal echogenicity. 5 mm lower pole
cyst. No other masses, no stones and no hydronephrosis.

Left Kidney:

Length: 9.8 cm. Echogenicity within normal limits. No mass or
hydronephrosis visualized.

Abdominal aorta:

No aneurysm visualized.

Other findings:

None.
IMPRESSION: 1. No liver mass.  No evidence of a hepatoma.
2. Coarsened liver echotexture with mild increased parenchymal
echogenicity and subtle surface nodularity. Findings support
cirrhosis.
3. Two large gallstones.  No acute cholecystitis.
4. Sub cm right renal cyst.

## 2015-05-04 DIAGNOSIS — D649 Anemia, unspecified: Secondary | ICD-10-CM | POA: Diagnosis not present

## 2015-05-04 DIAGNOSIS — D510 Vitamin B12 deficiency anemia due to intrinsic factor deficiency: Secondary | ICD-10-CM | POA: Diagnosis not present

## 2015-05-04 DIAGNOSIS — I10 Essential (primary) hypertension: Secondary | ICD-10-CM | POA: Diagnosis not present

## 2015-05-04 DIAGNOSIS — F250 Schizoaffective disorder, bipolar type: Secondary | ICD-10-CM | POA: Diagnosis not present

## 2015-05-04 DIAGNOSIS — Z6829 Body mass index (BMI) 29.0-29.9, adult: Secondary | ICD-10-CM | POA: Diagnosis not present

## 2015-05-04 DIAGNOSIS — B182 Chronic viral hepatitis C: Secondary | ICD-10-CM | POA: Diagnosis not present

## 2015-05-24 DIAGNOSIS — F339 Major depressive disorder, recurrent, unspecified: Secondary | ICD-10-CM | POA: Diagnosis not present

## 2015-08-27 DIAGNOSIS — F064 Anxiety disorder due to known physiological condition: Secondary | ICD-10-CM | POA: Diagnosis not present

## 2015-08-27 DIAGNOSIS — K5909 Other constipation: Secondary | ICD-10-CM | POA: Diagnosis not present

## 2015-08-27 DIAGNOSIS — M130 Polyarthritis, unspecified: Secondary | ICD-10-CM | POA: Diagnosis not present

## 2015-08-27 DIAGNOSIS — B182 Chronic viral hepatitis C: Secondary | ICD-10-CM | POA: Diagnosis not present

## 2015-08-27 DIAGNOSIS — I10 Essential (primary) hypertension: Secondary | ICD-10-CM | POA: Diagnosis not present

## 2015-08-27 DIAGNOSIS — D649 Anemia, unspecified: Secondary | ICD-10-CM | POA: Diagnosis not present

## 2015-08-27 DIAGNOSIS — Z6828 Body mass index (BMI) 28.0-28.9, adult: Secondary | ICD-10-CM | POA: Diagnosis not present

## 2015-08-27 DIAGNOSIS — F250 Schizoaffective disorder, bipolar type: Secondary | ICD-10-CM | POA: Diagnosis not present

## 2015-08-28 DIAGNOSIS — F339 Major depressive disorder, recurrent, unspecified: Secondary | ICD-10-CM | POA: Diagnosis not present

## 2015-10-12 DIAGNOSIS — I10 Essential (primary) hypertension: Secondary | ICD-10-CM | POA: Diagnosis not present

## 2015-10-12 DIAGNOSIS — B182 Chronic viral hepatitis C: Secondary | ICD-10-CM | POA: Diagnosis not present

## 2015-10-12 DIAGNOSIS — K5900 Constipation, unspecified: Secondary | ICD-10-CM | POA: Diagnosis not present

## 2015-10-12 DIAGNOSIS — F064 Anxiety disorder due to known physiological condition: Secondary | ICD-10-CM | POA: Diagnosis not present

## 2015-10-12 DIAGNOSIS — M130 Polyarthritis, unspecified: Secondary | ICD-10-CM | POA: Diagnosis not present

## 2015-10-12 DIAGNOSIS — F250 Schizoaffective disorder, bipolar type: Secondary | ICD-10-CM | POA: Diagnosis not present

## 2015-10-22 ENCOUNTER — Ambulatory Visit (HOSPITAL_COMMUNITY): Payer: Medicare Other | Admitting: Physical Therapy

## 2015-10-23 ENCOUNTER — Ambulatory Visit (HOSPITAL_COMMUNITY): Payer: Medicare Other | Admitting: Physical Therapy

## 2015-10-29 ENCOUNTER — Encounter (HOSPITAL_COMMUNITY): Payer: Self-pay | Admitting: Emergency Medicine

## 2015-10-29 ENCOUNTER — Emergency Department (HOSPITAL_COMMUNITY)
Admission: EM | Admit: 2015-10-29 | Discharge: 2015-10-30 | Disposition: A | Discharge status: Home / Self Care | Payer: Medicare Other | Attending: Emergency Medicine | Admitting: Emergency Medicine

## 2015-10-29 ENCOUNTER — Ambulatory Visit (HOSPITAL_COMMUNITY): Payer: Medicare Other | Attending: Family Medicine | Admitting: Physical Therapy

## 2015-10-29 DIAGNOSIS — M7989 Other specified soft tissue disorders: Secondary | ICD-10-CM | POA: Diagnosis present

## 2015-10-29 DIAGNOSIS — F329 Major depressive disorder, single episode, unspecified: Secondary | ICD-10-CM | POA: Diagnosis not present

## 2015-10-29 DIAGNOSIS — M1990 Unspecified osteoarthritis, unspecified site: Secondary | ICD-10-CM | POA: Diagnosis not present

## 2015-10-29 DIAGNOSIS — M6281 Muscle weakness (generalized): Secondary | ICD-10-CM | POA: Insufficient documentation

## 2015-10-29 DIAGNOSIS — I10 Essential (primary) hypertension: Secondary | ICD-10-CM | POA: Diagnosis not present

## 2015-10-29 DIAGNOSIS — R600 Localized edema: Secondary | ICD-10-CM | POA: Insufficient documentation

## 2015-10-29 DIAGNOSIS — M545 Low back pain, unspecified: Secondary | ICD-10-CM

## 2015-10-29 DIAGNOSIS — R2681 Unsteadiness on feet: Secondary | ICD-10-CM | POA: Diagnosis not present

## 2015-10-29 LAB — CBC
HEMATOCRIT: 35.1 % — AB (ref 36.0–46.0)
Hemoglobin: 11.4 g/dL — ABNORMAL LOW (ref 12.0–15.0)
MCH: 31 pg (ref 26.0–34.0)
MCHC: 32.5 g/dL (ref 30.0–36.0)
MCV: 95.4 fL (ref 78.0–100.0)
Platelets: 221 10*3/uL (ref 150–400)
RBC: 3.68 MIL/uL — ABNORMAL LOW (ref 3.87–5.11)
RDW: 16.3 % — AB (ref 11.5–15.5)
WBC: 12 10*3/uL — ABNORMAL HIGH (ref 4.0–10.5)

## 2015-10-29 NOTE — ED Notes (Signed)
Pt with increased swelling to lower extremities.

## 2015-10-29 NOTE — Therapy (Signed)
[place], [place], [address] Phone: [phone]   Fax:  [phone]  Physical Therapy Evaluation  Patient Details  Name: Alexis Ford MRN: [id number] Date of Birth: [birth year] Referring Provider: Lucianne Lei  Encounter Date: 10/29/2015      PT End of Session - 10/29/15 1210    Visit Number 1   Number of Visits 16   Date for PT Re-Evaluation 11/28/15   Authorization Type medicare   Authorization - Visit Number 1   Authorization - Number of Visits 10   PT Start Time 0815   PT Stop Time 0900   PT Time Calculation (min) 45 min   Activity Tolerance Patient tolerated treatment well      Past Medical History  Diagnosis Date  . Anxiety   . Depression   . Hypertension   . Arthritis     No past surgical history on file.  There were no vitals filed for this visit.       Subjective Assessment - 10/29/15 0815    Subjective Ms. Rackow is an 82 yo female who has been referred to skilled physical therapy due to low back pain.   Ms. Voller states that she has been having progressive low back pain for the past six months.  She states that her pain is worse pulling up from a forward bent position.  She states that her pain is worse in the morning.  She denies radicular pain.      Pertinent History anxiety, OA, RA, HNT,  , depression    How long can you sit comfortably? no problem    How long can you stand comfortably? 10 minutes    How long can you walk comfortably? 15 minutes    Patient Stated Goals less pain    Currently in Pain? No/denies  pt has taken medication this morning; worst pain 8/10 pulling self up from bending down.             [facility] PT Assessment - 10/29/15 0001    Assessment   Medical Diagnosis Low back pain   Referring Provider Lucianne Lei   Onset Date/Surgical Date 10/13/15   Prior Therapy none   Precautions   Precautions None   Restrictions   Weight Bearing Restrictions No   Balance  Screen   Has the patient fallen in the past 6 months No   Has the patient had a decrease in activity level because of a fear of falling?  Yes   Is the patient reluctant to leave their home because of a fear of falling?  No   Prior Function   Level of Independence Independent with community mobility with device   Vocation Retired   Associate Professor   Overall Cognitive Status Within Functional Limits for tasks assessed   Observation/Other Assessments   Focus on Therapeutic Outcomes (FOTO)  35   Observation/Other Assessments-Edema    Edema Circumferential   Circumferential Edema   Circumferential - Right 20 cm proximal RT  35.7,lt 39.0; 10 cmRT 32.3 Lt 33.1l heeel RT 36.1 LT 34.5; MTP 24.5  LT 25.2      Posture/Postural Control   Posture/Postural Control Postural limitations   Postural Limitations Rounded Shoulders;Forward head;Decreased lumbar lordosis;Increased thoracic kyphosis   ROM / Strength   AROM / PROM / Strength AROM;Strength   AROM   AROM Assessment Site Lumbar   Lumbar Flexion 20   Lumbar Extension 130   Lumbar - Right Side Bend 15  Lumbar - Left Side Bend 20   Strength   Strength Assessment Site Hip;Knee;Ankle   Right/Left Hip Right;Left   Right Hip Flexion 2+/5   Right Hip Extension 2/5   Right Hip ABduction 2-/5   Left Hip Flexion 2+/5   Left Hip Extension 2/5   Left Hip ABduction 2-/5   Right/Left Knee Right;Left   Right Knee Extension 4/5   Left Knee Extension 4/5   Right/Left Ankle Right;Left   Right Ankle Dorsiflexion 4/5   Left Ankle Dorsiflexion 4/5                   [facility] Adult PT Treatment/Exercise - 10/29/15 0001    Exercises   Exercises Lumbar   Lumbar Exercises: Stretches   Single Knee to Chest Stretch 2 reps;30 seconds   Lumbar Exercises: Supine   Ab Set 10 reps   Bridge 10 reps                PT Education - 10/29/15 0853    Education provided Yes   Education Details HEP   Person(s) Educated Patient   Methods  Explanation;Verbal cues;Handout;Tactile cues   Comprehension Verbalized understanding;Returned demonstration          PT Short Term Goals - 10/29/15 1221    PT SHORT TERM GOAL #1   Title Pt to be independent in low back exercises to decrease her pain to no greater than a 3/10 to decrease use of pain medication    Time 4   Period Weeks   Status New   PT SHORT TERM GOAL #2   Title Pt core and lower extremity strength to be increased by one grade to allow pt to be able to walk for 30 minutes at a time to be able to complete short shopping trips with confidence with a cane    Time 4   Period Weeks   Status New   PT SHORT TERM GOAL #3   Title Pt to be able to stand for 15 minutes to make a small meal and clean up without having increased low back pain    Time 4   Period Weeks   Status New   PT SHORT TERM GOAL #4   Title Pt to be able to come sit to stand without having increased low back pain    Time 4   Period Weeks   Status New           PT Long Term Goals - 10/29/15 1224    PT LONG TERM GOAL #1   Title Pt to be independent in advanced HEP to decrease back pain to a 1/10  so that patient does not have to take any medication for her back pain    Time 8   Period Weeks   Status New   PT LONG TERM GOAL #2   Title Pt core and LE strength to be increased by two levels to allow patient to be able to ascend and descend 5 steps without increased low back pain    Time 8   Period Weeks   Status New   PT LONG TERM GOAL #3   Title Pt to be able to walk in her yard with her cane with confidence and no low back pain    Time 8   Period Weeks   Status New               Plan - 10/29/15 1212    Clinical Impression Statement  Ms. Prato is an 82 yo female who has been having progressive back pain over the past several weeks.  She has been referred to skilled therapy.  Upon entering it was noted that Ms. Brandow has significant swelling in both LE when questioned she states that  this has only occured in the past week.  The therapist highly suggested that the pt returns to her MD due to the swelling in her legs as there maybe an underlying issue.  Evaluation demonstrates decreased ROM, decreased core and LE strength, unsteadiness on feet and increased pain.  Ms. Lettman will benefit from skilled physical therapy to address these issues, maximize her mobility and decrease her risk for falls.     Rehab Potential Good   PT Frequency 2x / week   PT Duration 8 weeks   PT Treatment/Interventions ADLs/Self Care Home Management;Moist Heat;Ultrasound;Gait training;Stair training;DME Instruction;Functional mobility training;Therapeutic activities;Therapeutic exercise;Balance training;Neuromuscular re-education;Patient/family education;Manual techniques   PT Next Visit Plan Begin 3 -D hip excursion, hamstring stretches, bent knee raise, heel raises and minisquats progress to sit to stand, sidelying and prone stabilization    PT Home Exercise Plan given    Consulted and Agree with Plan of Care Patient      Patient will benefit from skilled therapeutic intervention in order to improve the following deficits and impairments:  Abnormal gait, Decreased activity tolerance, Decreased balance, Decreased strength, Decreased range of motion, Difficulty walking, Pain, Increased edema, Postural dysfunction  Visit Diagnosis: Bilateral low back pain without sciatica - Plan: PT plan of care cert/re-cert  Muscle weakness (generalized) - Plan: PT plan of care cert/re-cert  Unsteadiness on feet - Plan: PT plan of care cert/re-cert      G-Codes - [id number]    Functional Limitation Mobility: Walking and moving around   Mobility: Walking and Moving Around Current Status [phone]) At least 60 percent but less than 80 percent impaired, limited or restricted   Mobility: Walking and Moving Around Goal Status [phone]) At least 40 percent but less than 60 percent impaired, limited or restricted        Problem List There are no active problems to display for this patient.   Rayetta Humphrey, PT CLT [phone] 10/29/2015, 12:35 PM  [facility] [address] [place], [place], [address] Phone: [phone]   Fax:  [phone]  Name: LORREN LOGSTON MRN: [id number] Date of Birth: March 09, 1934

## 2015-10-29 NOTE — Patient Instructions (Signed)
Isometric Abdominal    Lying on back with knees bent, tighten stomach by pressing elbows down. Hold _5___ seconds. Repeat 10____ times per set. Do _1___ sets per session. Do __2__ sessions per day.  http://orth.exer.us/1086   Copyright  VHI. All rights reserved.  Isometric Gluteals    Tighten buttock muscles. Repeat _10___ times per set. Do __1__ sets per session. Do __2__ sessions per day.  http://orth.exer.us/1126   Copyright  VHI. All rights reserved.  Bridging    Slowly raise buttocks from floor, keeping stomach tight. Repeat __10__ times per set. Do __1__ sets per session. Do _2___ sessions per day.  http://orth.exer.us/1096   Copyright  VHI. All rights reserved.  Knee-to-Chest Stretch: Unilateral    With hand behind right knee, pull knee in to chest until a comfortable stretch is felt in lower back and buttocks. Keep back relaxed. Hold _20___ seconds. Repeat __3__ times per set. Do _1___ sets per session. Do _2___ sessions per day.  http://orth.exer.us/126   Copyright  VHI. All rights reserved.

## 2015-10-29 NOTE — ED Provider Notes (Signed)
CSN: [id number]     Arrival date & time 10/29/15  2217 History  By signing my name below, I, Soijett Blue, attest that this documentation has been prepared under the direction and in the presence of Noemi Chapel, MD. Electronically Signed: Soijett Blue, ED Scribe. 10/29/2015. 11:35 PM.   Chief Complaint  Patient presents with  . Leg Swelling      The history is provided by the patient and a relative. No language interpreter was used.    Alexis Ford is a 82 y.o. female with a medical hx of HTN and arthritis, who presents to the Emergency Department complaining of increased BLE swelling onset 2 weeks. Pt notes that her BLE swelling is abnormal for her and prior to her current symptoms, pt didn't have leg swelling. Relative states that the pt informed her that there is a tightening sensation to her BLE. Denies taking medications for her cardiac issues. Pt states that she was Rx a steroid prescription on 10/23/15 by her PCP, but she is not clear on what the Rx was for. Pt notes that she was seen by her physical therapist today and was informed to come into the ED for further evaluation of her BLE swelling. She notes that she has not tried any medications for the relief of her symptoms. She denies color change, wound, rash, and any other symptoms. Denies taking blood thinners. Pt relative states that the pt smokes cigarettes daily.    Past Medical History  Diagnosis Date  . Anxiety   . Depression   . Hypertension   . Arthritis    History reviewed. No pertinent past surgical history. No family history on file. Social History  Substance Use Topics  . Smoking status: None  . Smokeless tobacco: None  . Alcohol Use: None   OB History    No data available     Review of Systems  Cardiovascular: Positive for leg swelling (BLE).  Skin: Negative for color change, rash and wound.  All other systems reviewed and are negative.     Allergies  Review of patient's allergies indicates no known  allergies.  Home Medications   Prior to Admission medications   Medication Sig Start Date End Date Taking? Authorizing Provider  ALPRAZolam Duanne Moron) 0.5 MG tablet Take 0.5 mg by mouth 3 (three) times daily as needed for anxiety.    Historical Provider, MD  diazepam (VALIUM) 5 MG tablet Take 5 mg by mouth 2 (two) times daily.    Historical Provider, MD  diclofenac sodium (VOLTAREN) 1 % GEL Apply 2 g topically 4 (four) times daily.    Historical Provider, MD  fesoterodine (TOVIAZ) 4 MG TB24 tablet Take 4 mg by mouth daily.    Historical Provider, MD  furosemide (LASIX) 20 MG tablet Take 1 tablet (20 mg total) by mouth daily. 10/30/15 11/06/15  Noemi Chapel, MD  hydrochlorothiazide (HYDRODIURIL) 25 MG tablet Take 25 mg by mouth daily.    Historical Provider, MD  HYDROcodone-acetaminophen (VICODIN) 5-500 MG per tablet Take 1 tablet by mouth 2 times daily at 12 noon and 4 pm.    Historical Provider, MD  Linaclotide (LINZESS) 145 MCG CAPS capsule Take 145 mcg by mouth daily.    Historical Provider, MD  meloxicam (MOBIC) 7.5 MG tablet Take 7.5 mg by mouth daily.    Historical Provider, MD  NIFEdipine (PROCARDIA-XL/ADALAT CC) 30 MG 24 hr tablet Take 30 mg by mouth daily.    Historical Provider, MD  PARoxetine (PAXIL) 40 MG tablet  Take 40 mg by mouth every morning.    Historical Provider, MD  potassium chloride SA (K-DUR,KLOR-CON) 20 MEQ tablet Take 20 mEq by mouth daily.    Historical Provider, MD  SitaGLIPtin-MetFORMIN HCl [id number] MG TB24 Take 1 tablet by mouth daily.    Historical Provider, MD   BP 148/71 mmHg  Pulse 72  Temp(Src) 98.2 F (36.8 C) (Oral)  Resp 16  Ht 5' 2.5" (1.588 m)  Wt 146 lb (66.225 kg)  BMI 26.26 kg/m2  SpO2 100% Physical Exam  Constitutional: She appears well-developed and well-nourished. No distress.  HENT:  Head: Normocephalic and atraumatic.  Mouth/Throat: Oropharynx is clear and moist. No oropharyngeal exudate.  Eyes: Conjunctivae and EOM are normal. Pupils are  equal, round, and reactive to light. Right eye exhibits no discharge. Left eye exhibits no discharge. No scleral icterus.  Neck: Normal range of motion. Neck supple. No JVD present. No thyromegaly present.  Cardiovascular: Normal rate, regular rhythm, normal heart sounds and intact distal pulses.  Exam reveals no gallop and no friction rub.   No murmur heard. Pulmonary/Chest: Effort normal and breath sounds normal. No respiratory distress. She has no wheezes. She has no rales.  Abdominal: Soft. Bowel sounds are normal. She exhibits no distension and no mass. There is no tenderness.  Musculoskeletal: Normal range of motion. She exhibits edema. She exhibits no tenderness.       Right ankle: She exhibits swelling.       Left ankle: She exhibits swelling.       Right lower leg: She exhibits edema.       Left lower leg: She exhibits edema.       Right foot: There is swelling.       Left foot: There is swelling.  Significant bilateral symmetrical pitting edema below knees through the feet.   Lymphadenopathy:    She has no cervical adenopathy.  Neurological: She is alert. Coordination normal.  Skin: Skin is warm and dry. No rash noted. No erythema.  Psychiatric: She has a normal mood and affect. Her behavior is normal.  Nursing note and vitals reviewed.   ED Course  Procedures (including critical care time) DIAGNOSTIC STUDIES: Oxygen Saturation is 100% on RA, nl by my interpretation.    COORDINATION OF CARE: 11:25 PM Discussed treatment plan with pt at bedside which includes labs and UA and pt agreed to plan.    Labs Review Labs Reviewed  COMPREHENSIVE METABOLIC PANEL - Abnormal; Notable for the following:    Potassium 3.3 (*)    Glucose, Bld 110 (*)    Calcium 8.5 (*)    Albumin 3.1 (*)    Alkaline Phosphatase 131 (*)    All other components within normal limits  CBC - Abnormal; Notable for the following:    WBC 12.0 (*)    RBC 3.68 (*)    Hemoglobin 11.4 (*)    HCT 35.1 (*)     RDW 16.3 (*)    All other components within normal limits  URINALYSIS, ROUTINE W REFLEX MICROSCOPIC (NOT AT [facility]) - Abnormal; Notable for the following:    Hgb urine dipstick TRACE (*)    All other components within normal limits  URINE MICROSCOPIC-ADD ON - Abnormal; Notable for the following:    Squamous Epithelial / LPF 0-5 (*)    Bacteria, UA MANY (*)    All other components within normal limits  BRAIN NATRIURETIC PEPTIDE    I have personally reviewed and evaluated these lab results as  part of my medical decision-making.   MDM   Final diagnoses:  Bilateral swelling of feet    I personally performed the services described in this documentation, which was scribed in my presence. The recorded information has been reviewed and is accurate.    The patient has no other symptoms other than swelling of the legs, her clinical exam is unremarkable, her vital signs are unremarkable and she is not hypoxic nor does she have any rales in her lungs. Her lab work shows a slightly lowered albumin but otherwise her liver function, renal function and cardiac function are normal including a BNP of 40 and a creatinine of 0.7 and normal liver function tests. The patient appears stable, I will start her on a small amount of Lasix daily for 1 week to follow-up in the outpatient setting. This could be from steroids, doubt bilateral DVTs  Meds given in ED:  Medications - No data to display  New Prescriptions   FUROSEMIDE (LASIX) 20 MG TABLET    Take 1 tablet (20 mg total) by mouth daily.      Noemi Chapel, MD 10/30/15 0130

## 2015-10-30 DIAGNOSIS — M7989 Other specified soft tissue disorders: Secondary | ICD-10-CM | POA: Diagnosis not present

## 2015-10-30 LAB — URINALYSIS, ROUTINE W REFLEX MICROSCOPIC
BILIRUBIN URINE: NEGATIVE
GLUCOSE, UA: NEGATIVE mg/dL
Ketones, ur: NEGATIVE mg/dL
Leukocytes, UA: NEGATIVE
Nitrite: NEGATIVE
Protein, ur: NEGATIVE mg/dL
SPECIFIC GRAVITY, URINE: 1.015 (ref 1.005–1.030)
pH: 7 (ref 5.0–8.0)

## 2015-10-30 LAB — COMPREHENSIVE METABOLIC PANEL
ALK PHOS: 131 U/L — AB (ref 38–126)
ALT: 16 U/L (ref 14–54)
AST: 25 U/L (ref 15–41)
Albumin: 3.1 g/dL — ABNORMAL LOW (ref 3.5–5.0)
Anion gap: 6 (ref 5–15)
BUN: 14 mg/dL (ref 6–20)
CALCIUM: 8.5 mg/dL — AB (ref 8.9–10.3)
CO2: 26 mmol/L (ref 22–32)
CREATININE: 0.77 mg/dL (ref 0.44–1.00)
Chloride: 107 mmol/L (ref 101–111)
Glucose, Bld: 110 mg/dL — ABNORMAL HIGH (ref 65–99)
Potassium: 3.3 mmol/L — ABNORMAL LOW (ref 3.5–5.1)
Sodium: 139 mmol/L (ref 135–145)
Total Bilirubin: 0.4 mg/dL (ref 0.3–1.2)
Total Protein: 6.6 g/dL (ref 6.5–8.1)

## 2015-10-30 LAB — URINE MICROSCOPIC-ADD ON

## 2015-10-30 LAB — BRAIN NATRIURETIC PEPTIDE: B NATRIURETIC PEPTIDE 5: 40 pg/mL (ref 0.0–100.0)

## 2015-10-30 MED ORDER — FUROSEMIDE 20 MG PO TABS: 20.0000 mg | ORAL_TABLET | Freq: Every day | ORAL | Status: DC

## 2015-11-01 DIAGNOSIS — R2243 Localized swelling, mass and lump, lower limb, bilateral: Secondary | ICD-10-CM | POA: Diagnosis not present

## 2015-11-06 ENCOUNTER — Ambulatory Visit (HOSPITAL_COMMUNITY): Payer: Medicare Other

## 2015-11-06 DIAGNOSIS — M545 Low back pain, unspecified: Secondary | ICD-10-CM

## 2015-11-06 DIAGNOSIS — R2681 Unsteadiness on feet: Secondary | ICD-10-CM

## 2015-11-06 DIAGNOSIS — M6281 Muscle weakness (generalized): Secondary | ICD-10-CM | POA: Diagnosis not present

## 2015-11-06 NOTE — Therapy (Signed)
[place], [place], [address] Phone: [phone]   Fax:  [phone]  Physical Therapy Treatment  Patient Details  Name: Alexis Ford MRN: [id number] Date of Birth: [birth year] Referring Provider: Lucianne Lei  Encounter Date: 11/06/2015      PT End of Session - 11/06/15 1047    Visit Number 2   Number of Visits 16   Date for PT Re-Evaluation 11/28/15   Authorization Type medicare   Authorization - Visit Number 2   Authorization - Number of Visits 10   PT Start Time [id number]   PT Stop Time 1108   PT Time Calculation (min) 38 min   Activity Tolerance Patient tolerated treatment well   Behavior During Therapy --  distracted, perseverating, and difficulty understanding instructions.       Past Medical History  Diagnosis Date  . Anxiety   . Depression   . Hypertension   . Arthritis     No past surgical history on file.  There were no vitals filed for this visit.      Subjective Assessment - 11/06/15 1034    Subjective Pt reports that she has been doing alright. Her HEP has not been performed as she would have liked to as she has been busy with her daughter who was in from out [place] to assist with some house repairs. She reports that the swelling in her legs mysteriously resolved on its own.    Pertinent History anxiety, OA, RA, HNT,  , depression    Currently in Pain? Yes   Pain Score 0-No pain                         [facility] Adult PT Treatment/Exercise - 11/06/15 0001    Lumbar Exercises: Stretches   Active Hamstring Stretch --  Not warranted; HS length is > 170 degrees bilat   Single Knee to Chest Stretch 30 seconds;3 reps  requires heavy verbal cues for correction   Single Knee to Chest Stretch Limitations contralalteral hip in neutral    Lower Trunk Rotation 3 reps;30 seconds  alteranting bilat   Lumbar Exercises: Standing   Functional Squats 10 reps   Functional Squats  Limitations 2x10   Lumbar Exercises: Supine   Ab Set --  unable; ran out of time.    Bridge 10 reps  2x10; knees at 90 to better target hamstrings                  PT Short Term Goals - 10/29/15 1221    PT SHORT TERM GOAL #1   Title Pt to be independent in low back exercises to decrease her pain to no greater than a 3/10 to decrease use of pain medication    Time 4   Period Weeks   Status New   PT SHORT TERM GOAL #2   Title Pt core and lower extremity strength to be increased by one grade to allow pt to be able to walk for 30 minutes at a time to be able to complete short shopping trips with confidence with a cane    Time 4   Period Weeks   Status New   PT SHORT TERM GOAL #3   Title Pt to be able to stand for 15 minutes to make a small meal and clean up without having increased low back pain    Time 4   Period Weeks   Status New  PT SHORT TERM GOAL #4   Title Pt to be able to come sit to stand without having increased low back pain    Time 4   Period Weeks   Status New           PT Long Term Goals - 10/29/15 1224    PT LONG TERM GOAL #1   Title Pt to be independent in advanced HEP to decrease back pain to a 1/10  so that patient does not have to take any medication for her back pain    Time 8   Period Weeks   Status New   PT LONG TERM GOAL #2   Title Pt core and LE strength to be increased by two levels to allow patient to be able to ascend and descend 5 steps without increased low back pain    Time 8   Period Weeks   Status New   PT LONG TERM GOAL #3   Title Pt to be able to walk in her yard with her cane with confidence and no low back pain    Time 8   Period Weeks   Status New               Plan - 11/06/15 1049    Clinical Impression Statement Pt tolerates treatment session well today, able to perform all acitivties little or no increase in pain. The pt requires extensive verbal cues for exercises and intructions. The patient is  perseverating on vague details from her most recent doctors visit, but these comments are often off topic and seem to create addition confusion. Able to advance exercises today with success, additional time required to get through each exercise as she occasionally loses track of what she is doing halfway through the exercise. Will continue with POC as previously laid out in evaluation.    Rehab Potential Fair   Clinical Impairments Affecting Rehab Potential Suspect some cognitive impairment that may limit independence in HEP at home and continued self care s/p DC.    PT Frequency 2x / week   PT Duration 8 weeks   PT Treatment/Interventions ADLs/Self Care Home Management;Moist Heat;Ultrasound;Gait training;Stair training;DME Instruction;Functional mobility training;Therapeutic activities;Therapeutic exercise;Balance training;Neuromuscular re-education;Patient/family education;Manual techniques   PT Next Visit Plan Begin 3 -D hip excursion, hamstring stretches; Continue with bent knee raise, heel raises and minisquats    PT Home Exercise Plan given    Consulted and Agree with Plan of Care Patient      Patient will benefit from skilled therapeutic intervention in order to improve the following deficits and impairments:  Abnormal gait, Decreased activity tolerance, Decreased balance, Decreased strength, Decreased range of motion, Difficulty walking, Pain, Increased edema, Postural dysfunction  Visit Diagnosis: Bilateral low back pain without sciatica  Unsteadiness on feet  Muscle weakness (generalized)     Problem List There are no active problems to display for this patient.   11:11 AM, 11/06/2015 Etta Grandchild, PT, DPT Physical Therapist at [facility] [phone] (office)     [place], [place], [address] Phone: [phone]   Fax:  [phone]  Name: Alexis Ford MRN:  [id number] Date of Birth: January 16, 1934

## 2015-11-08 ENCOUNTER — Ambulatory Visit (HOSPITAL_COMMUNITY): Payer: Medicare Other

## 2015-11-08 DIAGNOSIS — R2681 Unsteadiness on feet: Secondary | ICD-10-CM

## 2015-11-08 DIAGNOSIS — M545 Low back pain, unspecified: Secondary | ICD-10-CM

## 2015-11-08 DIAGNOSIS — M6281 Muscle weakness (generalized): Secondary | ICD-10-CM | POA: Diagnosis not present

## 2015-11-08 NOTE — Therapy (Signed)
[place], [place], [address] Phone: [phone]   Fax:  [phone]  Physical Therapy Treatment  Patient Details  Name: Alexis Ford MRN: [id number] Date of Birth: 1934/01/15 Referring Provider: Lucianne Lei  Encounter Date: 11/08/2015      PT End of Session - 11/08/15 0919    Visit Number 3   Number of Visits 16   Date for PT Re-Evaluation 11/28/15   Authorization Type medicare   Authorization - Visit Number 3   Authorization - Number of Visits 10   PT Start Time 0908  arrived late   PT Stop Time 0946   PT Time Calculation (min) 38 min   Equipment Utilized During Treatment Gait belt   Activity Tolerance Patient tolerated treatment well;Patient limited by pain   Behavior During Therapy --  distracted, labile, and impulsive      Past Medical History  Diagnosis Date  . Anxiety   . Depression   . Hypertension   . Arthritis     No past surgical history on file.  There were no vitals filed for this visit.      Subjective Assessment - 11/08/15 0913    Subjective Pt reports she is pretty stiff this morning as she comes in dragging her RLE antalgically. She is using a broken metal pole as an AD and reports that she was unable to get to her [facility] due to contruction at the house. She says she decided to work on her HEP rather than working out in the yard .   Pertinent History anxiety, OA, RA, HNT,  , depression    Currently in Pain? Yes   Pain Location Buttocks  Pt reports that she has pain just before she needs to void, and if she does not void fully.    Pain Orientation Right                         [facility] Adult PT Treatment/Exercise - 11/08/15 0001    Lumbar Exercises: Stretches   Single Knee to Chest Stretch 30 seconds;3 reps  requires heavy verbal cues for correction   Single Knee to Chest Stretch Limitations contralalteral hip in neutral    Lumbar Exercises: Seated   Long Arc  Quad on Chair 2 sets;10 reps  2x10 bilat, 2# cuff weights   Sit to Stand 10 reps  2x10 hands free, VC for upright posture and TKE   Lumbar Exercises: Supine   Bent Knee Raise 20 reps  2x10 bilat   Bridge 10 reps  2x10; knees at 90 to better target hamstrings   Lumbar Exercises: Prone   Other Prone Lumbar Exercises RPIE: 1x20  minimal lumbar movement, sustains thoracic kyphosis.                   PT Short Term Goals - 10/29/15 1221    PT SHORT TERM GOAL #1   Title Pt to be independent in low back exercises to decrease her pain to no greater than a 3/10 to decrease use of pain medication    Time 4   Period Weeks   Status New   PT SHORT TERM GOAL #2   Title Pt core and lower extremity strength to be increased by one grade to allow pt to be able to walk for 30 minutes at a time to be able to complete short shopping trips with confidence with a cane    Time  4   Period Weeks   Status New   PT SHORT TERM GOAL #3   Title Pt to be able to stand for 15 minutes to make a small meal and clean up without having increased low back pain    Time 4   Period Weeks   Status New   PT SHORT TERM GOAL #4   Title Pt to be able to come sit to stand without having increased low back pain    Time 4   Period Weeks   Status New           PT Long Term Goals - 10/29/15 1224    PT LONG TERM GOAL #1   Title Pt to be independent in advanced HEP to decrease back pain to a 1/10  so that patient does not have to take any medication for her back pain    Time 8   Period Weeks   Status New   PT LONG TERM GOAL #2   Title Pt core and LE strength to be increased by two levels to allow patient to be able to ascend and descend 5 steps without increased low back pain    Time 8   Period Weeks   Status New   PT LONG TERM GOAL #3   Title Pt to be able to walk in her yard with her cane with confidence and no low back pain    Time 8   Period Weeks   Status New               Plan -  11/08/15 [id number]    Clinical Impression Statement Pt arrived late for session: she remains distracted, labile, and somewhat confused. She requires heavy verbal cues to remain on task, and tactile cues for exercise form. She continues to complain of R sided posterior pelvic pain with most supine RLE exercises. Progress toward goals is difficult to assess at this time due to intermittent crying throughout session, but patient iis able to perform more activity due to being kept on task more. She is reporting improved HEP compliance at home, and improved tolerance ot mobility.  Of concern are her complaints that her pain is somewhat related to her urinary habits. She continually expresses some distress over  interactions that she has with her family at home regarding their involvement in her care and their concern for her decline.    Rehab Potential Fair   Clinical Impairments Affecting Rehab Potential Suspect some cognitive impairment that may limit independence in HEP at home and continued self care s/p DC.    PT Frequency 2x / week   PT Duration 8 weeks   PT Treatment/Interventions ADLs/Self Care Home Management;Moist Heat;Ultrasound;Gait training;Stair training;DME Instruction;Functional mobility training;Therapeutic activities;Therapeutic exercise;Balance training;Neuromuscular re-education;Patient/family education;Manual techniques   PT Next Visit Plan Begin 3 -D hip excursion; Continue with bent knee raise, heel raises and minisquats    Consulted and Agree with Plan of Care Patient      Patient will benefit from skilled therapeutic intervention in order to improve the following deficits and impairments:  Abnormal gait, Decreased activity tolerance, Decreased balance, Decreased strength, Decreased range of motion, Difficulty walking, Pain, Increased edema, Postural dysfunction  Visit Diagnosis: Bilateral low back pain without sciatica  Unsteadiness on feet  Muscle weakness  (generalized)     Problem List There are no active problems to display for this patient.   9:50 AM, 11/08/2015 Etta Grandchild, PT, DPT Physical Therapist at [facility]  Outpatient Rehab [phone] (office)     [place] [address] [place], [place], [address] Phone: [phone]   Fax:  [phone]  Name: Alexis Ford MRN: [id number] Date of Birth: [birth year]

## 2015-11-13 ENCOUNTER — Ambulatory Visit (HOSPITAL_COMMUNITY): Payer: Medicare Other | Admitting: Physical Therapy

## 2015-11-13 DIAGNOSIS — M6281 Muscle weakness (generalized): Secondary | ICD-10-CM

## 2015-11-13 DIAGNOSIS — M545 Low back pain, unspecified: Secondary | ICD-10-CM

## 2015-11-13 DIAGNOSIS — R2681 Unsteadiness on feet: Secondary | ICD-10-CM

## 2015-11-13 NOTE — Therapy (Signed)
[place], [place], [address] Phone: [phone]   Fax:  [phone]  Physical Therapy Treatment  Patient Details  Name: Alexis Ford MRN: [id number] Date of Birth: Apr 22, 1934 Referring Provider: Lucianne Lei  Encounter Date: 11/13/2015      PT End of Session - 11/13/15 1057    Visit Number 4   Number of Visits 16   Date for PT Re-Evaluation 11/28/15   Authorization Type medicare   Authorization - Visit Number 4   Authorization - Number of Visits 10   PT Start Time 0915   PT Stop Time 1015   PT Time Calculation (min) 60 min   Equipment Utilized During Treatment Gait belt   Activity Tolerance Patient tolerated treatment well;Patient limited by pain   Behavior During Therapy --  distracted, labile, and impulsive      Past Medical History  Diagnosis Date  . Anxiety   . Depression   . Hypertension   . Arthritis     No past surgical history on file.  There were no vitals filed for this visit.      Subjective Assessment - 11/13/15 0930    Subjective PT states she has trouble emptying her bowels. States she has severe pain in her Rt SI region and into her bowel area from time to time but when her bowels move she doesn't have a problem.   Pt comes today with a wooden SPC but using incorrectly on wrong side of body.                           [facility] Adult PT Treatment/Exercise - 11/13/15 0001    Lumbar Exercises: Stretches   Lower Trunk Rotation 3 reps;30 seconds   Piriformis Stretch 2 reps;30 seconds   Piriformis Stretch Limitations seated with AAROM from therapist   Lumbar Exercises: Seated   Sit to Stand 10 reps   Lumbar Exercises: Supine   Bent Knee Raise 20 reps   Bridge 15 reps   Straight Leg Raise 10 reps   Straight Leg Raises Limitations bilateral   Lumbar Exercises: Prone   Other Prone Lumbar Exercises prone lying X 2 minutes   Manual Therapy   Manual Therapy Myofascial  release   Manual therapy comments completed separate from all other skilled interventions   Myofascial Release to Rt SI joint and pirifomis musculature in prone lying position                  PT Short Term Goals - 10/29/15 1221    PT SHORT TERM GOAL #1   Title Pt to be independent in low back exercises to decrease her pain to no greater than a 3/10 to decrease use of pain medication    Time 4   Period Weeks   Status New   PT SHORT TERM GOAL #2   Title Pt core and lower extremity strength to be increased by one grade to allow pt to be able to walk for 30 minutes at a time to be able to complete short shopping trips with confidence with a cane    Time 4   Period Weeks   Status New   PT SHORT TERM GOAL #3   Title Pt to be able to stand for 15 minutes to make a small meal and clean up without having increased low back pain    Time 4   Period Weeks  Status New   PT SHORT TERM GOAL #4   Title Pt to be able to come sit to stand without having increased low back pain    Time 4   Period Weeks   Status New           PT Long Term Goals - 10/29/15 1224    PT LONG TERM GOAL #1   Title Pt to be independent in advanced HEP to decrease back pain to a 1/10  so that patient does not have to take any medication for her back pain    Time 8   Period Weeks   Status New   PT LONG TERM GOAL #2   Title Pt core and LE strength to be increased by two levels to allow patient to be able to ascend and descend 5 steps without increased low back pain    Time 8   Period Weeks   Status New   PT LONG TERM GOAL #3   Title Pt to be able to walk in her yard with her cane with confidence and no low back pain    Time 8   Period Weeks   Status New               Plan - 11/13/15 1057    Clinical Impression Statement Pt arrived late for session.  Pt remains distracted and gets off on tangents during therapy.  Periods ofcrying/laughing and hard to follow what she is talking about.   Requires constant redirection to complete therex in correct form and maintain count.  PT wtih noted tightness in Rt piriformis.  Added seated stretch wtih AAROM and completed manual to area to decrease tightness at EOS.  Pt encouraged to continue her HEP.   Instructed patient to use SPC on Lt side to assist with Rt SI pain.  Noted improvement with decrease antalgia following and without pain or antalgia at end of session.   Rehab Potential Fair   Clinical Impairments Affecting Rehab Potential Suspect some cognitive impairment that may limit independence in HEP at home and continued self care s/p DC.    PT Frequency 2x / week   PT Duration 8 weeks   PT Treatment/Interventions ADLs/Self Care Home Management;Moist Heat;Ultrasound;Gait training;Stair training;DME Instruction;Functional mobility training;Therapeutic activities;Therapeutic exercise;Balance training;Neuromuscular re-education;Patient/family education;Manual techniques   PT Next Visit Plan Continue to progress therex as able.  Add supine piriformis stretch next session.     Consulted and Agree with Plan of Care Patient      Patient will benefit from skilled therapeutic intervention in order to improve the following deficits and impairments:  Abnormal gait, Decreased activity tolerance, Decreased balance, Decreased strength, Decreased range of motion, Difficulty walking, Pain, Increased edema, Postural dysfunction  Visit Diagnosis: Bilateral low back pain without sciatica  Unsteadiness on feet  Muscle weakness (generalized)     Problem List There are no active problems to display for this patient.   Teena Irani, PTA/CLT [phone]  11/13/2015, 11:02 AM  [place], [place], [address] Phone: [phone]   Fax:  [phone]  Name: Alexis Ford MRN: [id number] Date of Birth: [birth year]

## 2015-11-15 ENCOUNTER — Ambulatory Visit (HOSPITAL_COMMUNITY): Payer: Medicare Other | Admitting: Physical Therapy

## 2015-11-15 ENCOUNTER — Telehealth (HOSPITAL_COMMUNITY): Payer: Self-pay

## 2015-11-15 NOTE — Telephone Encounter (Signed)
11/15/15 caller said she wouldn't be here today

## 2015-11-22 ENCOUNTER — Ambulatory Visit (HOSPITAL_COMMUNITY): Payer: Medicare Other | Admitting: Physical Therapy

## 2015-11-22 DIAGNOSIS — M6281 Muscle weakness (generalized): Secondary | ICD-10-CM

## 2015-11-22 DIAGNOSIS — R2681 Unsteadiness on feet: Secondary | ICD-10-CM | POA: Diagnosis not present

## 2015-11-22 DIAGNOSIS — M545 Low back pain, unspecified: Secondary | ICD-10-CM

## 2015-11-22 NOTE — Therapy (Signed)
[place], [place], [address] Phone: [phone]   Fax:  [phone]  Physical Therapy Treatment  Patient Details  Name: Alexis Ford MRN: [id number] Date of Birth: [birth year] Referring Provider: Lucianne Lei  Encounter Date: 11/22/2015      PT End of Session - 11/22/15 0838    Visit Number 5   Number of Visits 16   Date for PT Re-Evaluation 11/28/15   Authorization Type medicare   Authorization - Visit Number 5   Authorization - Number of Visits 10   PT Start Time [phone]   PT Stop Time 0900   PT Time Calculation (min) 43 min   Equipment Utilized During Treatment Gait belt   Activity Tolerance Patient tolerated treatment well;Patient limited by pain   Behavior During Therapy --  distracted, labile, and impulsive      Past Medical History:  Diagnosis Date  . Anxiety   . Arthritis   . Depression   . Hypertension     No past surgical history on file.  There were no vitals filed for this visit.      Subjective Assessment - 11/22/15 0819    Subjective Pt states that she has a lot going on in her life so she has not been doing her exercises on a regular basis.  Pt hurts when she walks.    Currently in Pain? Yes   Pain Location Buttocks   Pain Orientation Lower;Right   Pain Descriptors / Indicators Aching   Pain Type Chronic pain   Aggravating Factors  weight bearing   Pain Relieving Factors sitting   Effect of Pain on Daily Activities increases                          [facility] Adult PT Treatment/Exercise - 11/22/15 0001      Lumbar Exercises: Stretches   Single Knee to Chest Stretch 3 reps;30 seconds   Standing Extension 5 reps   Piriformis Stretch 2 reps;30 seconds     Lumbar Exercises: Supine   Ab Set 10 reps   Glut Set 10 reps   Bent Knee Raise 10 reps   Bridge 10 reps   Other Supine Lumbar Exercises Isometric abduction and  adduction x 10                    PT Short Term Goals - 11/22/15 0840      PT SHORT TERM GOAL #1   Title Pt to be independent in low back exercises to decrease her pain to no greater than a 3/10 to decrease use of pain medication    Time 4   Period Weeks   Status On-going     PT SHORT TERM GOAL #2   Title Pt core and lower extremity strength to be increased by one grade to allow pt to be able to walk for 30 minutes at a time to be able to complete short shopping trips with confidence with a cane    Time 4   Period Weeks   Status On-going     PT SHORT TERM GOAL #3   Title Pt to be able to stand for 15 minutes to make a small meal and clean up without having increased low back pain    Time 4   Period Weeks   Status On-going     PT SHORT TERM GOAL #4   Title Pt to be able  to come sit to stand without having increased low back pain    Time 4   Period Weeks   Status On-going           PT Long Term Goals - 11/22/15 0841      PT LONG TERM GOAL #1   Title Pt to be independent in advanced HEP to decrease back pain to a 1/10  so that patient does not have to take any medication for her back pain    Time 8   Period Weeks   Status On-going     PT LONG TERM GOAL #2   Title Pt core and LE strength to be increased by two levels to allow patient to be able to ascend and descend 5 steps without increased low back pain    Time 8   Period Weeks   Status On-going     PT LONG TERM GOAL #3   Title Pt to be able to walk in her yard with her cane with confidence and no low back pain    Time 8   Period Weeks   Status On-going               Plan - 11/22/15 [id number]    Clinical Impression Statement Very difficult to keep pt on track with therapist having to pull patient back to the topic at hand, her back/buttock pain.  Pt needs therapist facilitation with all exercises to ensure correct form.  Added piriformis stretch to pt.     Rehab Potential Fair   Clinical Impairments Affecting Rehab Potential Suspect some  cognitive impairment that may limit independence in HEP at home and continued self care s/p DC.    PT Frequency 2x / week   PT Duration 8 weeks   PT Treatment/Interventions ADLs/Self Care Home Management;Moist Heat;Ultrasound;Gait training;Stair training;DME Instruction;Functional mobility training;Therapeutic activities;Therapeutic exercise;Balance training;Neuromuscular re-education;Patient/family education;Manual techniques   PT Next Visit Plan Continue to progress therex as able but back sure pt is independent and completing current exercises correctly.      Consulted and Agree with Plan of Care Patient      Patient will benefit from skilled therapeutic intervention in order to improve the following deficits and impairments:  Abnormal gait, Decreased activity tolerance, Decreased balance, Decreased strength, Decreased range of motion, Difficulty walking, Pain, Increased edema, Postural dysfunction  Visit Diagnosis: Bilateral low back pain without sciatica  Unsteadiness on feet  Muscle weakness (generalized)     Problem List There are no active problems to display for this patient.   Rayetta Humphrey, PT CLT [phone] 11/22/2015, 9:03 AM  [place] [address] [place], [place], [address] Phone: [phone]   Fax:  [phone]  Name: Alexis Ford MRN: [id number] Date of Birth: [birth year]

## 2015-11-22 NOTE — Patient Instructions (Addendum)
Extensors / Rotators, Supine    Lie supine, one leg straight, other leg bent, knee held by opposite hand. Gently pull knee toward opposite shoulder. Feel stretch in buttocks and outside of hip. Hold _30__ seconds. Repeat _3__ times per session. Do _1__ sessions per day.  Copyright  [employer]. All rights reserved.  Extensors / Rotators, Supine    Lie supine, legs bent, feet flat. Keeping head, neck, and back flat, bring one ankle over the other knee. Use top leg to pull bottom leg toward floor. Hold _30__ seconds.  Repeat 1___ times per session. Do ___ sessions per day. 3

## 2015-11-26 ENCOUNTER — Ambulatory Visit (HOSPITAL_COMMUNITY): Payer: Medicare Other | Admitting: Physical Therapy

## 2015-11-26 DIAGNOSIS — R2681 Unsteadiness on feet: Secondary | ICD-10-CM

## 2015-11-26 DIAGNOSIS — M6281 Muscle weakness (generalized): Secondary | ICD-10-CM | POA: Diagnosis not present

## 2015-11-26 DIAGNOSIS — R2243 Localized swelling, mass and lump, lower limb, bilateral: Secondary | ICD-10-CM | POA: Diagnosis not present

## 2015-11-26 DIAGNOSIS — F250 Schizoaffective disorder, bipolar type: Secondary | ICD-10-CM | POA: Diagnosis not present

## 2015-11-26 DIAGNOSIS — I10 Essential (primary) hypertension: Secondary | ICD-10-CM | POA: Diagnosis not present

## 2015-11-26 DIAGNOSIS — M545 Low back pain, unspecified: Secondary | ICD-10-CM

## 2015-11-26 DIAGNOSIS — M130 Polyarthritis, unspecified: Secondary | ICD-10-CM | POA: Diagnosis not present

## 2015-11-26 NOTE — Therapy (Signed)
[place], [place], [address] Phone: [phone]   Fax:  [phone]  Physical Therapy Treatment  Patient Details  Name: Alexis Ford MRN: [id number] Date of Birth: 1933-06-26 Referring Provider: Lucianne Lei  Encounter Date: 11/26/2015      PT End of Session - 11/26/15 1011    Visit Number 6   Number of Visits 16   Date for PT Re-Evaluation 11/28/15   Authorization Type medicare   Authorization - Visit Number 6   Authorization - Number of Visits 10   PT Start Time [id number]   PT Stop Time 1030   PT Time Calculation (min) 38 min   Equipment Utilized During Treatment Gait belt   Activity Tolerance Patient tolerated treatment well;Patient limited by pain   Behavior During Therapy --  distracted, labile, and impulsive      Past Medical History:  Diagnosis Date  . Anxiety   . Arthritis   . Depression   . Hypertension     No past surgical history on file.  There were no vitals filed for this visit.      Subjective Assessment - 11/26/15 1001    Subjective Pt states she does not hurt until she starts walking, moving or turning.  As long as she sits still she is good.  States she is trying to do her HEP.   Currently in Pain? No/denies                         [facility] Adult PT Treatment/Exercise - 11/26/15 1003      Lumbar Exercises: Stretches   Single Knee to Chest Stretch 3 reps;30 seconds   Standing Extension 5 reps   Piriformis Stretch 2 reps;30 seconds     Lumbar Exercises: Supine   Ab Set 10 reps   Glut Set 10 reps   Heel Slides 10 reps   Bent Knee Raise 10 reps   Bridge 10 reps   Bridge Limitations 2 sets 10 reps   Other Supine Lumbar Exercises isometric adduction x 10    Other Supine Lumbar Exercises SAQ 15 reps each                  PT Short Term Goals - 11/22/15 0840      PT SHORT TERM GOAL #1   Title Pt to be independent in low back exercises to decrease her  pain to no greater than a 3/10 to decrease use of pain medication    Time 4   Period Weeks   Status On-going     PT SHORT TERM GOAL #2   Title Pt core and lower extremity strength to be increased by one grade to allow pt to be able to walk for 30 minutes at a time to be able to complete short shopping trips with confidence with a cane    Time 4   Period Weeks   Status On-going     PT SHORT TERM GOAL #3   Title Pt to be able to stand for 15 minutes to make a small meal and clean up without having increased low back pain    Time 4   Period Weeks   Status On-going     PT SHORT TERM GOAL #4   Title Pt to be able to come sit to stand without having increased low back pain    Time 4   Period Weeks   Status On-going  PT Long Term Goals - 11/22/15 0841      PT LONG TERM GOAL #1   Title Pt to be independent in advanced HEP to decrease back pain to a 1/10  so that patient does not have to take any medication for her back pain    Time 8   Period Weeks   Status On-going     PT LONG TERM GOAL #2   Title Pt core and LE strength to be increased by two levels to allow patient to be able to ascend and descend 5 steps without increased low back pain    Time 8   Period Weeks   Status On-going     PT LONG TERM GOAL #3   Title Pt to be able to walk in her yard with her cane with confidence and no low back pain    Time 8   Period Weeks   Status On-going               Plan - 11/26/15 1019    Clinical Impression Statement Continued difiucilty to keep patient on track as she is easily distracted and focused on family issues with finances and "they want me to sign papers".  Pt nearly in tears at times.  Added SAQ and heelslides as with severely weak LE muscles.  NOted swelling in distal LE"s bilaterally and recommended patient obtain and wear compression stockings.    Rehab Potential Fair   Clinical Impairments Affecting Rehab Potential Suspect some cognitive impairment  that may limit independence in HEP at home and continued self care s/p DC.    PT Frequency 2x / week   PT Duration 8 weeks   PT Treatment/Interventions ADLs/Self Care Home Management;Moist Heat;Ultrasound;Gait training;Stair training;DME Instruction;Functional mobility training;Therapeutic activities;Therapeutic exercise;Balance training;Neuromuscular re-education;Patient/family education;Manual techniques   PT Next Visit Plan Continue to progress therex as able.  Begin closed chain exercises as able.    Consulted and Agree with Plan of Care Patient      Patient will benefit from skilled therapeutic intervention in order to improve the following deficits and impairments:  Abnormal gait, Decreased activity tolerance, Decreased balance, Decreased strength, Decreased range of motion, Difficulty walking, Pain, Increased edema, Postural dysfunction  Visit Diagnosis: Bilateral low back pain without sciatica  Unsteadiness on feet  Muscle weakness (generalized)     Problem List There are no active problems to display for this patient.   Teena Irani, PTA/CLT [phone]  11/26/2015, 11:21 AM  [place] [address] [place], [place], [address] Phone: [phone]   Fax:  [phone]  Name: Alexis Ford MRN: [id number] Date of Birth: 1934/02/20

## 2015-11-28 ENCOUNTER — Ambulatory Visit (HOSPITAL_COMMUNITY): Payer: Medicare Other | Attending: Family Medicine | Admitting: Physical Therapy

## 2015-11-28 DIAGNOSIS — M545 Low back pain: Secondary | ICD-10-CM | POA: Insufficient documentation

## 2015-11-28 DIAGNOSIS — R2681 Unsteadiness on feet: Secondary | ICD-10-CM | POA: Insufficient documentation

## 2015-11-28 DIAGNOSIS — M6281 Muscle weakness (generalized): Secondary | ICD-10-CM | POA: Insufficient documentation

## 2015-12-03 ENCOUNTER — Ambulatory Visit (HOSPITAL_COMMUNITY): Payer: Medicare Other | Admitting: Physical Therapy

## 2015-12-03 DIAGNOSIS — R2681 Unsteadiness on feet: Secondary | ICD-10-CM | POA: Diagnosis not present

## 2015-12-03 DIAGNOSIS — M545 Low back pain, unspecified: Secondary | ICD-10-CM

## 2015-12-03 DIAGNOSIS — M6281 Muscle weakness (generalized): Secondary | ICD-10-CM

## 2015-12-03 NOTE — Therapy (Signed)
[place], [place], [address] Phone: [phone]   Fax:  [phone]  Physical Therapy Treatment (Re-Assessment)  Patient Details  Name: Alexis Ford MRN: [id number] Date of Birth: [birth year] Referring Provider: Lucianne Lei  Encounter Date: 12/03/2015      PT End of Session - 12/03/15 0947    Visit Number 7   Number of Visits 15   Date for PT Re-Evaluation 12/31/15   Authorization Type medicare (G-codes done 7th session)   Authorization - Visit Number 7   Authorization - Number of Visits 17   PT Start Time 0902   PT Stop Time 0943   PT Time Calculation (min) 41 min   Activity Tolerance Patient tolerated treatment well   Behavior During Therapy [facility] for tasks assessed/performed      Past Medical History:  Diagnosis Date  . Anxiety   . Arthritis   . Depression   . Hypertension     No past surgical history on file.  There were no vitals filed for this visit.      Subjective Assessment - 12/03/15 0905    Subjective Patient arrives stating she is a little flustered and feels rushed this morning as she just drove back from [place], where she had a doctor's appointment. She states that her back is hurting (points to L3-L4 area), and is sore in this general area. Patient appears somewhat unfocused this morning, but states she feels like she is getting better. Overall she rates herself as being around an 80/100 right now.    Pertinent History anxiety, OA, RA, HNT,  , depression     How long can you sit comfortably? 12/03/15- unlimited    How long can you stand comfortably? 12/03/15- 10-20 minutes, not a clear answer    How long can you walk comfortably? 12/03/15- did not give a clear answer    Patient Stated Goals less pain    Currently in Pain? Other (Comment)  patient did not give clear answer, did not rate on pain scale             [facility] PT Assessment - 12/03/15 0001      AROM   Lumbar Flexion 84   Lumbar Extension 12   Lumbar - Right Side Bend 13   Lumbar - Left Side Bend 18     Strength   Right Hip Extension 2/5   Right Hip ABduction 2/5   Left Hip Extension 2/5   Left Hip ABduction 2/5   Right Knee Extension 3+/5   Left Knee Extension 3+/5   Right Ankle Dorsiflexion 4+/5   Left Ankle Dorsiflexion 4+/5                             PT Education - 12/03/15 0946    Education provided Yes   Education Details progress with skilled PT services, POC moving forward, importance of compliance with HEP, talk to PCP about seeing GI specialist due to presentation of symptoms    Person(s) Educated Patient   Methods Explanation   Comprehension Verbalized understanding          PT Short Term Goals - 12/03/15 0929      PT SHORT TERM GOAL #1   Title Pt to be independent in low back exercises to decrease her pain to no greater than a 3/10 to decrease use of pain medication    Baseline 8/7- she is  not doing them like she is supposed to, her pain can still get to 9-10/10   Time 4   Period Weeks   Status On-going     PT SHORT TERM GOAL #2   Title Pt core and lower extremity strength to be increased by one grade to allow pt to be able to walk for 30 minutes at a time to be able to complete short shopping trips with confidence with a cane    Baseline 8/7- no significant improvements in strength, reports she still cannot walk for 30 minutes    Time 4   Period Weeks   Status On-going     PT SHORT TERM GOAL #3   Title Pt to be able to stand for 15 minutes to make a small meal and clean up without having increased low back pain    Baseline 8/7- patient reports she is able to do so    Time 4   Period Weeks   Status Achieved     PT SHORT TERM GOAL #4   Title Pt to be able to come sit to stand without having increased low back pain    Baseline 8/7- no pain    Time 4   Period Weeks   Status Achieved           PT Long Term Goals - 12/03/15 0932      PT LONG  TERM GOAL #1   Title Pt to be independent in advanced HEP to decrease back pain to a 1/10  so that patient does not have to take any medication for her back pain    Time 8   Period Weeks   Status On-going     PT LONG TERM GOAL #2   Title Pt core and LE strength to be increased by two levels to allow patient to be able to ascend and descend 5 steps without increased low back pain    Time 8   Period Weeks   Status On-going     PT LONG TERM GOAL #3   Title Pt to be able to walk in her yard with her cane with confidence and no low back pain    Baseline 8/7- reports confidence with cane but is still having pain   Time 8   Period Weeks   Status Partially Met               Plan - 12/03/15 0947    Clinical Impression Statement Re-assessment performed today. Patient arrives stating she is a bit flustered as she just got back from [place], and is anxious this morning; throughout session she required cues to stay on tasks today. Upon examination, did not note significant changes in objective measures however patient does report some improvements in her function at home at this time. She does state that her back pain often seems to be related to when she has to go to the bathroom, and when she has a bowel movement her back pain is often relieved. Educated patient regarding importance of keeping up with regular HEP, and also provided encouragement to speak to PCP regarding possible GI doctor. At this point recommend continuing skilled PT services in order to attempt to reach optimal level of function and monitor overall progression of symptoms.    Rehab Potential Fair   Clinical Impairments Affecting Rehab Potential Suspect some cognitive impairment that may limit independence in HEP at home and continued self care s/p DC.    PT Frequency 2x /  week   PT Duration 4 weeks   PT Treatment/Interventions ADLs/Self Care Home Management;Moist Heat;Ultrasound;Gait training;Stair training;DME  Instruction;Functional mobility training;Therapeutic activities;Therapeutic exercise;Balance training;Neuromuscular re-education;Patient/family education;Manual techniques   PT Next Visit Plan Continue to progress therex as able.  Begin closed chain exercises as able. Continue to monitor possible bowel pain referral pattern.    PT Home Exercise Plan given    Consulted and Agree with Plan of Care Patient      Patient will benefit from skilled therapeutic intervention in order to improve the following deficits and impairments:  Abnormal gait, Decreased activity tolerance, Decreased balance, Decreased strength, Decreased range of motion, Difficulty walking, Pain, Increased edema, Postural dysfunction  Visit Diagnosis: Bilateral low back pain without sciatica  Unsteadiness on feet  Muscle weakness (generalized)       G-Codes - 22-Dec-2015 0948    Functional Assessment Tool Used Based on skilled clinical assessment of ROM, strength, posture, pain patterns    Functional Limitation Mobility: Walking and moving around   Mobility: Walking and Moving Around Current Status ([id number]) At least 60 percent but less than 80 percent impaired, limited or restricted   Mobility: Walking and Moving Around Goal Status [phone]) At least 40 percent but less than 60 percent impaired, limited or restricted      Problem List There are no active problems to display for this patient.   [name], DPT [phone]  [place] [address] [place], [place], [address] Phone: [phone]   Fax:  [phone]  Name: FARRAN AMSDEN MRN: [id number] Date of Birth: 09/10/1933

## 2015-12-05 ENCOUNTER — Ambulatory Visit (HOSPITAL_COMMUNITY): Payer: Medicare Other | Admitting: Physical Therapy

## 2015-12-05 ENCOUNTER — Telehealth (HOSPITAL_COMMUNITY): Payer: Self-pay

## 2015-12-05 NOTE — Telephone Encounter (Signed)
Patient over slept.

## 2015-12-10 ENCOUNTER — Ambulatory Visit (HOSPITAL_COMMUNITY): Payer: Medicare Other | Admitting: Physical Therapy

## 2015-12-10 DIAGNOSIS — M545 Low back pain, unspecified: Secondary | ICD-10-CM

## 2015-12-10 DIAGNOSIS — M6281 Muscle weakness (generalized): Secondary | ICD-10-CM

## 2015-12-10 DIAGNOSIS — R2681 Unsteadiness on feet: Secondary | ICD-10-CM | POA: Diagnosis not present
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
## 2015-12-12 ENCOUNTER — Ambulatory Visit (HOSPITAL_COMMUNITY): Payer: Medicare Other | Admitting: Physical Therapy

## 2015-12-12 DIAGNOSIS — M545 Low back pain, unspecified: Secondary | ICD-10-CM

## 2015-12-12 DIAGNOSIS — M6281 Muscle weakness (generalized): Secondary | ICD-10-CM

## 2015-12-12 DIAGNOSIS — R2681 Unsteadiness on feet: Secondary | ICD-10-CM | POA: Diagnosis not present

## 2015-12-12 NOTE — Therapy (Signed)
[place], [place], [address] Phone: [phone]   Fax:  [phone]  Physical Therapy Treatment  Patient Details  Name: Alexis Ford MRN: [id number] Date of Birth: 04/08/1934 Referring Provider: Lucianne Lei  Encounter Date: 12/12/2015      PT End of Session - 12/12/15 0943    Visit Number 9   Number of Visits 15   Date for PT Re-Evaluation 12/31/15   Authorization Type medicare (G-codes done 7th session)   Authorization - Visit Number 9   Authorization - Number of Visits 17   PT Start Time 0902   PT Stop Time 0942   PT Time Calculation (min) 40 min   Activity Tolerance Patient tolerated treatment well   Behavior During Therapy [facility] for tasks assessed/performed      Past Medical History:  Diagnosis Date  . Anxiety   . Arthritis   . Depression   . Hypertension     No past surgical history on file.  There were no vitals filed for this visit.      Subjective Assessment - 12/12/15 0905    Subjective Patient arrives today stating she is just rushed, she is concerned because last night she was by herself and she felt like her house was full of people, she felt she just keep seeing shadows like someone was there. She did have a change in her medicines recently and she is wondering if her medicine change could be related to this.    Pertinent History anxiety, OA, RA, HNT,  , depression     Currently in Pain? No/denies  patient states she feels good                          [facility] Adult PT Treatment/Exercise - 12/12/15 0001      Lumbar Exercises: Standing   Heel Raises 10 reps   Heel Raises Limitations heel and toe    Other Standing Lumbar Exercises step ups on 4 inch step 1x10      Lumbar Exercises: Supine   Ab Set 15 reps   AB Set Limitations 2 second hold    Glut Set 15 reps   Glut Set Limitations 2 second holds   difficulty with form    Bridge 10 reps   Bridge Limitations 2  sets    Other Supine Lumbar Exercises hip ABD 1x5 with red TB                 PT Education - 12/12/15 0942    Education provided Yes   Education Details talk to MD about about feelings of house being full of people/possible hallucinations after medicine changes    Person(s) Educated Patient   Methods Explanation   Comprehension Verbalized understanding          PT Short Term Goals - 12/03/15 0929      PT SHORT TERM GOAL #1   Title Pt to be independent in low back exercises to decrease her pain to no greater than a 3/10 to decrease use of pain medication    Baseline 8/7- she is not doing them like she is supposed to, her pain can still get to 9-10/10   Time 4   Period Weeks   Status On-going     PT SHORT TERM GOAL #2   Title Pt core and lower extremity strength to be increased by one grade to allow pt to be able  to walk for 30 minutes at a time to be able to complete short shopping trips with confidence with a cane    Baseline 8/7- no significant improvements in strength, reports she still cannot walk for 30 minutes    Time 4   Period Weeks   Status On-going     PT SHORT TERM GOAL #3   Title Pt to be able to stand for 15 minutes to make a small meal and clean up without having increased low back pain    Baseline 8/7- patient reports she is able to do so    Time 4   Period Weeks   Status Achieved     PT SHORT TERM GOAL #4   Title Pt to be able to come sit to stand without having increased low back pain    Baseline 8/7- no pain    Time 4   Period Weeks   Status Achieved           PT Long Term Goals - 12/03/15 0932      PT LONG TERM GOAL #1   Title Pt to be independent in advanced HEP to decrease back pain to a 1/10  so that patient does not have to take any medication for her back pain    Time 8   Period Weeks   Status On-going     PT LONG TERM GOAL #2   Title Pt core and LE strength to be increased by two levels to allow patient to be able to ascend  and descend 5 steps without increased low back pain    Time 8   Period Weeks   Status On-going     PT LONG TERM GOAL #3   Title Pt to be able to walk in her yard with her cane with confidence and no low back pain    Baseline 8/7- reports confidence with cane but is still having pain   Time 8   Period Weeks   Status Partially Met               Plan - 12/12/15 0943    Clinical Impression Statement Patient arrived today reporting concerns about being rushed and feeling like her house is full of people since she stopped taking a medicine per her MD's recommendation; patient described multiple recent situations where she has been confused or uneasy recently. Performed functional exercises on mat table today and then performed MMSE with patient today to screen cognitive status. Cues required for correct form with exercise throughout session. Patient remains very talkative and requires quite a few cues to focus on session today, however was able to initiate some CKC work at end of session today. Total score of 23/30 on MMSE.    Rehab Potential Fair   Clinical Impairments Affecting Rehab Potential Suspect some cognitive impairment that may limit independence in HEP at home and continued self care s/p DC.    PT Frequency 2x / week   PT Duration 4 weeks   PT Treatment/Interventions ADLs/Self Care Home Management;Moist Heat;Ultrasound;Gait training;Stair training;DME Instruction;Functional mobility training;Therapeutic activities;Therapeutic exercise;Balance training;Neuromuscular re-education;Patient/family education;Manual techniques   PT Next Visit Plan Continue to progress therex as able.  Begin closed chain exercises as able. Continue to monitor possible bowel pain referral pattern.    Consulted and Agree with Plan of Care Patient      Patient will benefit from skilled therapeutic intervention in order to improve the following deficits and impairments:  Abnormal gait, Decreased activity  tolerance,  Decreased balance, Decreased strength, Decreased range of motion, Difficulty walking, Pain, Increased edema, Postural dysfunction  Visit Diagnosis: Bilateral low back pain without sciatica  Unsteadiness on feet  Muscle weakness (generalized)     Problem List There are no active problems to display for this patient.   [name], DPT [phone]  [facility] [address] [place], [place], [address] Phone: [phone]   Fax:  [phone]  Name: Alexis Ford MRN: [id number] Date of Birth: 25-Aug-1933

## 2015-12-17 ENCOUNTER — Ambulatory Visit (HOSPITAL_COMMUNITY): Payer: Medicare Other | Admitting: Physical Therapy

## 2015-12-17 DIAGNOSIS — M545 Low back pain, unspecified: Secondary | ICD-10-CM

## 2015-12-17 DIAGNOSIS — R2681 Unsteadiness on feet: Secondary | ICD-10-CM | POA: Diagnosis not present

## 2015-12-17 DIAGNOSIS — M6281 Muscle weakness (generalized): Secondary | ICD-10-CM | POA: Diagnosis not present

## 2015-12-17 NOTE — Therapy (Signed)
[place], [place], [address] Phone: [phone]   Fax:  [phone]  Physical Therapy Treatment  Patient Details  Name: Alexis Ford MRN: [id number] Date of Birth: June 30, 1933 Referring Provider: Lucianne Lei  Encounter Date: 12/17/2015      PT End of Session - 12/17/15 0901    Visit Number 10   Number of Visits 15   Date for PT Re-Evaluation 12/31/15   Authorization Type medicare (G-codes done 7th session)   Authorization - Visit Number 10   Authorization - Number of Visits 17   PT Start Time 0830   PT Stop Time 0900   PT Time Calculation (min) 30 min   Activity Tolerance Patient tolerated treatment well   Behavior During Therapy [facility] for tasks assessed/performed      Past Medical History:  Diagnosis Date  . Anxiety   . Arthritis   . Depression   . Hypertension     No past surgical history on file.  There were no vitals filed for this visit.      Subjective Assessment - 12/17/15 0843    Subjective Pt states she was "dragging" this morning and didn't sleep too good last night due to pain.  Pt states her bowels are moving better without strain.  Currently without pain just some soreness.  Comes today without AD.                         [facility] Adult PT Treatment/Exercise - 12/17/15 0001      Lumbar Exercises: Supine   Ab Set 15 reps   AB Set Limitations 2 second hold    Bridge 10 reps   Bridge Limitations 2 sets    Straight Leg Raise 10 reps     Lumbar Exercises: Sidelying   Hip Abduction 10 reps                  PT Short Term Goals - 12/03/15 0929      PT SHORT TERM GOAL #1   Title Pt to be independent in low back exercises to decrease her pain to no greater than a 3/10 to decrease use of pain medication    Baseline 8/7- she is not doing them like she is supposed to, her pain can still get to 9-10/10   Time 4   Period Weeks   Status On-going     PT SHORT  TERM GOAL #2   Title Pt core and lower extremity strength to be increased by one grade to allow pt to be able to walk for 30 minutes at a time to be able to complete short shopping trips with confidence with a cane    Baseline 8/7- no significant improvements in strength, reports she still cannot walk for 30 minutes    Time 4   Period Weeks   Status On-going     PT SHORT TERM GOAL #3   Title Pt to be able to stand for 15 minutes to make a small meal and clean up without having increased low back pain    Baseline 8/7- patient reports she is able to do so    Time 4   Period Weeks   Status Achieved     PT SHORT TERM GOAL #4   Title Pt to be able to come sit to stand without having increased low back pain    Baseline 8/7- no pain    Time  4   Period Weeks   Status Achieved           PT Long Term Goals - 12/03/15 0932      PT LONG TERM GOAL #1   Title Pt to be independent in advanced HEP to decrease back pain to a 1/10  so that patient does not have to take any medication for her back pain    Time 8   Period Weeks   Status On-going     PT LONG TERM GOAL #2   Title Pt core and LE strength to be increased by two levels to allow patient to be able to ascend and descend 5 steps without increased low back pain    Time 8   Period Weeks   Status On-going     PT LONG TERM GOAL #3   Title Pt to be able to walk in her yard with her cane with confidence and no low back pain    Baseline 8/7- reports confidence with cane but is still having pain   Time 8   Period Weeks   Status Partially Met               Plan - 12/17/15 0947    Clinical Impression Statement Pt arrived late for appt today.  States she is no longer feelling like her house is full of people and did not contact her MD.  Marena Chancy if she is still taking her medication.  Pt advised to return to MD or call concerning events.  Pt continues to be talkative requiring redirection during session.  Advanced to sidelying hip  abduction and added supine SLR this session with noted weakness but able to complete without pain.     Rehab Potential Fair   Clinical Impairments Affecting Rehab Potential Suspect some cognitive impairment that may limit independence in HEP at home and continued self care s/p DC.    PT Frequency 2x / week   PT Duration 4 weeks   PT Treatment/Interventions ADLs/Self Care Home Management;Moist Heat;Ultrasound;Gait training;Stair training;DME Instruction;Functional mobility training;Therapeutic activities;Therapeutic exercise;Balance training;Neuromuscular re-education;Patient/family education;Manual techniques   PT Next Visit Plan Continue to progress therex as able.  Begin closed chain exercises as able. Continue to monitor possible bowel pain referral pattern.    Consulted and Agree with Plan of Care Patient      Patient will benefit from skilled therapeutic intervention in order to improve the following deficits and impairments:  Abnormal gait, Decreased activity tolerance, Decreased balance, Decreased strength, Decreased range of motion, Difficulty walking, Pain, Increased edema, Postural dysfunction  Visit Diagnosis: Bilateral low back pain without sciatica  Unsteadiness on feet  Muscle weakness (generalized)     Problem List There are no active problems to display for this patient.   Teena Irani, PTA/CLT [phone]  12/17/2015, 9:49 AM  [facility] [address] [place], [place], [address] Phone: [phone]   Fax:  [phone]  Name: SHANDIIN EISENBEIS MRN: [id number] Date of Birth: 09-09-1933

## 2015-12-19 ENCOUNTER — Ambulatory Visit (HOSPITAL_COMMUNITY): Payer: Medicare Other | Admitting: Physical Therapy

## 2015-12-20 ENCOUNTER — Ambulatory Visit (HOSPITAL_COMMUNITY): Payer: Medicare Other | Admitting: Physical Therapy

## 2015-12-20 DIAGNOSIS — M545 Low back pain, unspecified: Secondary | ICD-10-CM

## 2015-12-20 DIAGNOSIS — M6281 Muscle weakness (generalized): Secondary | ICD-10-CM

## 2015-12-20 DIAGNOSIS — R2681 Unsteadiness on feet: Secondary | ICD-10-CM

## 2015-12-20 NOTE — Therapy (Signed)
[place], [place], [address] Phone: [phone]   Fax:  [phone]  Physical Therapy Treatment  Patient Details  Name: Alexis Ford MRN: [id number] Date of Birth: Oct 19, 1933 Referring Provider: Lucianne Lei  Encounter Date: 12/20/2015      Ford End of Session - 12/20/15 1733    Visit Number 11   Number of Visits 15   Date for Ford Re-Evaluation 12/31/15   Authorization Type medicare (G-codes done 7th session)   Authorization - Visit Number 11   Authorization - Number of Visits 17   Ford Start Time 3267  Ford running a few minutes behind from last patient   Ford Stop Time 1513   Ford Time Calculation (min) 36 min   Activity Tolerance Patient tolerated treatment well   Behavior During Therapy [facility] for tasks assessed/performed      Past Medical History:  Diagnosis Date  . Anxiety   . Arthritis   . Depression   . Hypertension     No past surgical history on file.  There were no vitals filed for this visit.      Subjective Assessment - 12/20/15 1439    Subjective Patient arrives today stating that she was dragging this morning and has been confused about her appointment times. Arrived today without AD. Her daughter is now being treated for anxiety. Her bowels are moving better, she moved twice in one day without strain and her back is feeling better.    Pertinent History anxiety, OA, RA, HNT,  , depression     Currently in Pain? No/denies                         [facility] Adult Ford Treatment/Exercise - 12/20/15 0001      Lumbar Exercises: Standing   Heel Raises 10 reps   Heel Raises Limitations heel and toe    Forward Lunge 10 reps   Forward Lunge Limitations 4 inch box    Side Lunge 10 reps;Other (comment)  4 inch box    Other Standing Lumbar Exercises forward and lateral step ups 4 inch box 1x10      Lumbar Exercises: Supine   Bridge 15 reps   Other Supine Lumbar Exercises hip ABD 1x5 with  red TB    Other Supine Lumbar Exercises log rolling for supine to sit                 Ford Education - 12/20/15 1733    Education provided No          Ford Short Term Goals - 12/03/15 0929      Ford SHORT TERM GOAL #1   Title Ford to be independent in low back exercises to decrease her pain to no greater than a 3/10 to decrease use of pain medication    Baseline 8/7- she is not doing them like she is supposed to, her pain can still get to 9-10/10   Time 4   Period Weeks   Status On-going     Ford SHORT TERM GOAL #2   Title Ford core and lower extremity strength to be increased by one grade to allow Ford to be able to walk for 30 minutes at a time to be able to complete short shopping trips with confidence with a cane    Baseline 8/7- no significant improvements in strength, reports she still cannot walk for 30 minutes    Time 4  Period Weeks   Status On-going     Ford SHORT TERM GOAL #3   Title Ford to be able to stand for 15 minutes to make a small meal and clean up without having increased low back pain    Baseline 8/7- patient reports she is able to do so    Time 4   Period Weeks   Status Achieved     Ford SHORT TERM GOAL #4   Title Ford to be able to come sit to stand without having increased low back pain    Baseline 8/7- no pain    Time 4   Period Weeks   Status Achieved           Ford Long Term Goals - 12/03/15 0932      Ford LONG TERM GOAL #1   Title Ford to be independent in advanced HEP to decrease back pain to a 1/10  so that patient does not have to take any medication for her back pain    Time 8   Period Weeks   Status On-going     Ford LONG TERM GOAL #2   Title Ford core and LE strength to be increased by two levels to allow patient to be able to ascend and descend 5 steps without increased low back pain    Time 8   Period Weeks   Status On-going     Ford LONG TERM GOAL #3   Title Ford to be able to walk in her yard with her cane with confidence and no low back pain     Baseline 8/7- reports confidence with cane but is still having pain   Time 8   Period Weeks   Status Partially Met               Plan - 12/20/15 1734    Clinical Impression Statement Continued focus on functional exercise with significantly increased focused on standing work today in order to further address functional muscle strength deficits today. Verbal cues required throughout session for focus on functional exercises. Utilized frequent verbal and visual demonstration and cues for correct form throughout session today. Fatigue noted with increased focus on CKC activity. Also interesting to note that patient has reported more quality and frequency bowel movements and also, with similar timing, reduction in her back pain. Continued encouraging patient to speak about past bowel issues with MD at next appt.    Rehab Potential Fair   Clinical Impairments Affecting Rehab Potential Suspect some cognitive impairment that may limit independence in HEP at home and continued self care s/p DC.    Ford Frequency 2x / week   Ford Duration 4 weeks   Ford Treatment/Interventions ADLs/Self Care Home Management;Moist Heat;Ultrasound;Gait training;Stair training;DME Instruction;Functional mobility training;Therapeutic activities;Therapeutic exercise;Balance training;Neuromuscular re-education;Patient/family education;Manual techniques   Ford Next Visit Plan continue CKC exercises, continue to monitor possible bowel referral pattern. Initiate balance.    Ford Home Exercise Plan given    Consulted and Agree with Plan of Care Patient      Patient will benefit from skilled therapeutic intervention in order to improve the following deficits and impairments:  Abnormal gait, Decreased activity tolerance, Decreased balance, Decreased strength, Decreased range of motion, Difficulty walking, Pain, Increased edema, Postural dysfunction  Visit Diagnosis: Bilateral low back pain without sciatica  Unsteadiness on  feet  Muscle weakness (generalized)     Problem List There are no active problems to display for this patient.   Alexis Ford, DPT [phone]  [place], [place], [address] Phone: [phone]   Fax:  [phone]  Name: Alexis Ford MRN: [id number] Date of Birth: Feb 08, 1934

## 2015-12-24 ENCOUNTER — Ambulatory Visit (HOSPITAL_COMMUNITY): Payer: Medicare Other | Admitting: Physical Therapy

## 2015-12-24 ENCOUNTER — Other Ambulatory Visit: Payer: Self-pay

## 2015-12-24 ENCOUNTER — Telehealth (HOSPITAL_COMMUNITY): Payer: Self-pay

## 2015-12-24 NOTE — Telephone Encounter (Signed)
Pt left a message that her son-in-law was in a car wreck and her daughter is who brings her.... She has no ride this morning

## 2015-12-26 ENCOUNTER — Ambulatory Visit (HOSPITAL_COMMUNITY): Payer: Medicare Other

## 2015-12-26 DIAGNOSIS — M6281 Muscle weakness (generalized): Secondary | ICD-10-CM

## 2015-12-26 DIAGNOSIS — M545 Low back pain, unspecified: Secondary | ICD-10-CM

## 2015-12-26 DIAGNOSIS — R2681 Unsteadiness on feet: Secondary | ICD-10-CM | POA: Diagnosis not present

## 2015-12-26 NOTE — Therapy (Signed)
[facility] [address] [place], [place], [address] Phone: [phone]   Fax:  [phone]  Physical Therapy Treatment  Patient Details  Name: Alexis Ford MRN: [id number] Date of Birth: 1933-07-01 Referring Provider: Renaye Rakers  Encounter Date: 12/26/2015      PT End of Session - 12/26/15 0908    Visit Number 12   Number of Visits 15   Date for PT Re-Evaluation 12/31/15   Authorization Type medicare (G-codes done 7th session)   Authorization - Visit Number 12   Authorization - Number of Visits 17   PT Start Time 0902   PT Stop Time 0944   PT Time Calculation (min) 42 min   Activity Tolerance Patient tolerated treatment well   Behavior During Therapy [facility] for tasks assessed/performed      Past Medical History:  Diagnosis Date  . Anxiety   . Arthritis   . Depression   . Hypertension     No past surgical history on file.  There were no vitals filed for this visit.      Subjective Assessment - 12/26/15 0904    Subjective Pt stated she was a little slow this morning and sore in lower back and hips.  Pt entered dept without any AD.  No reports of recent falls.   Pertinent History anxiety, OA, RA, HNT,  , depression     Patient Stated Goals less pain    Currently in Pain? No/denies             [facility] Adult PT Treatment/Exercise - 12/26/15 0001      Lumbar Exercises: Standing   Heel Raises 15 reps   Heel Raises Limitations heel and toe    Forward Lunge 15 reps   Forward Lunge Limitations 4 inch box    Other Standing Lumbar Exercises forward and lateral step ups 4 inch box 1x10    Other Standing Lumbar Exercises SLS Lt 6", Rt 8" max of 3; tandem stance 2x 30"     Lumbar Exercises: Seated   Sit to Stand 10 reps;Other (comment)   Sit to Stand Limitations no HHA, cueing for eccentric control                 PT Education - 12/26/15 1104    Education provided Yes   Education Details Pt given flier for  classes to assist with stopping smoking.  Discussed importance of addressing bowel issues with MD next apt.     Person(s) Educated Patient   Methods Explanation;Demonstration;Handout   Comprehension Verbalized understanding;Returned demonstration;Need further instruction          PT Short Term Goals - 12/03/15 0929      PT SHORT TERM GOAL #1   Title Pt to be independent in low back exercises to decrease her pain to no greater than a 3/10 to decrease use of pain medication    Baseline 8/7- she is not doing them like she is supposed to, her pain can still get to 9-10/10   Time 4   Period Weeks   Status On-going     PT SHORT TERM GOAL #2   Title Pt core and lower extremity strength to be increased by one grade to allow pt to be able to walk for 30 minutes at a time to be able to complete short shopping trips with confidence with a cane    Baseline 8/7- no significant improvements in strength, reports she still cannot walk for 30 minutes  Time 4   Period Weeks   Status On-going     PT SHORT TERM GOAL #3   Title Pt to be able to stand for 15 minutes to make a small meal and clean up without having increased low back pain    Baseline 8/7- patient reports she is able to do so    Time 4   Period Weeks   Status Achieved     PT SHORT TERM GOAL #4   Title Pt to be able to come sit to stand without having increased low back pain    Baseline 8/7- no pain    Time 4   Period Weeks   Status Achieved           PT Long Term Goals - 12/03/15 0932      PT LONG TERM GOAL #1   Title Pt to be independent in advanced HEP to decrease back pain to a 1/10  so that patient does not have to take any medication for her back pain    Time 8   Period Weeks   Status On-going     PT LONG TERM GOAL #2   Title Pt core and LE strength to be increased by two levels to allow patient to be able to ascend and descend 5 steps without increased low back pain    Time 8   Period Weeks   Status On-going      PT LONG TERM GOAL #3   Title Pt to be able to walk in her yard with her cane with confidence and no low back pain    Baseline 8/7- reports confidence with cane but is still having pain   Time 8   Period Weeks   Status Partially Met               Plan - 12/26/15 1054    Clinical Impression Statement Session focus on improving functional strengthening exercises with focus on CKC exercises.  Addition of balance activities this session.  Therapist facilitation required for proper form and technique with majority of exercises for maximal benefits and min A with balance activities this session for safety.  During tandem stance pt reports "feelling woozy", pt had a seat, given water and BP monitored with 135/73 mmHg and HR at 75.  Pt also mentioned wishing to stop smoking, pt educated on benefits and given flier for classes to educate and assist with stopping.  EOS pt limited by fatigue, no reports of pain through session.     Rehab Potential Fair   Clinical Impairments Affecting Rehab Potential Suspect some cognitive impairment that may limit independence in HEP at home and continued self care s/p DC.    PT Frequency 2x / week   PT Duration 4 weeks   PT Next Visit Plan continue CKC exercises, continue to monitor possible bowel referral pattern. Initiate balance.    Recommended Other Services Pt given flier for classes to assist with stopping smoking      Patient will benefit from skilled therapeutic intervention in order to improve the following deficits and impairments:  Abnormal gait, Decreased activity tolerance, Decreased balance, Decreased strength, Decreased range of motion, Difficulty walking, Pain, Increased edema, Postural dysfunction  Visit Diagnosis: Bilateral low back pain without sciatica  Unsteadiness on feet  Muscle weakness (generalized)     Problem List There are no active problems to display for this patient.  [address] LPTA;  [place]  Aldona Lento 12/26/2015, 11:05 AM  [place], [place], [address] Phone: [phone]   Fax:  [phone]  Name: Alexis Ford MRN: [id number] Date of Birth: Feb 08, 1934

## 2015-12-28 DIAGNOSIS — D649 Anemia, unspecified: Secondary | ICD-10-CM | POA: Diagnosis not present

## 2015-12-28 DIAGNOSIS — I10 Essential (primary) hypertension: Secondary | ICD-10-CM | POA: Diagnosis not present

## 2015-12-28 DIAGNOSIS — F339 Major depressive disorder, recurrent, unspecified: Secondary | ICD-10-CM | POA: Diagnosis not present

## 2016-01-01 ENCOUNTER — Ambulatory Visit (HOSPITAL_COMMUNITY): Payer: Medicare Other | Attending: Family Medicine | Admitting: Physical Therapy

## 2016-01-01 ENCOUNTER — Telehealth (HOSPITAL_COMMUNITY): Payer: Self-pay | Admitting: Physical Therapy

## 2016-01-01 DIAGNOSIS — M545 Low back pain: Secondary | ICD-10-CM | POA: Insufficient documentation

## 2016-01-01 DIAGNOSIS — M6281 Muscle weakness (generalized): Secondary | ICD-10-CM | POA: Insufficient documentation

## 2016-01-01 DIAGNOSIS — R2681 Unsteadiness on feet: Secondary | ICD-10-CM | POA: Insufficient documentation

## 2016-01-01 NOTE — Telephone Encounter (Signed)
Patient a no-show for today's session; called and spoke to patient, who reported she had been trying to call to cancel but couldn't find our number. Gave clinic number to patient and reminded her of time/date of next scheduled session.  [name], DPT [phone]

## 2016-01-03 ENCOUNTER — Ambulatory Visit (HOSPITAL_COMMUNITY): Payer: Medicare Other | Admitting: Physical Therapy

## 2016-01-03 DIAGNOSIS — M6281 Muscle weakness (generalized): Secondary | ICD-10-CM

## 2016-01-03 DIAGNOSIS — M545 Low back pain, unspecified: Secondary | ICD-10-CM

## 2016-01-03 DIAGNOSIS — R2681 Unsteadiness on feet: Secondary | ICD-10-CM | POA: Diagnosis not present

## 2016-01-03 NOTE — Therapy (Signed)
[place], [place], [address] Phone: [phone]   Fax:  [phone]  Physical Therapy Treatment (Re-Assessment)  Patient Details  Name: Alexis Ford MRN: [id number] Date of Birth: Oct 12, 1933 Referring Provider: Lucianne Lei   Encounter Date: 01/03/2016      PT End of Session - 01/03/16 1246    Visit Number 13   Number of Visits 14   Authorization Type medicare (G-codes done 13th session)   Authorization - Visit Number 13   Authorization - Number of Visits 23   PT Start Time 0903   PT Stop Time 0942   PT Time Calculation (min) 39 min   Activity Tolerance Patient tolerated treatment well   Behavior During Therapy [facility] for tasks assessed/performed      Past Medical History:  Diagnosis Date  . Anxiety   . Arthritis   . Depression   . Hypertension     No past surgical history on file.  There were no vitals filed for this visit.      Subjective Assessment - 01/03/16 0906    Subjective Patient arrives today stating she is doing well, states she got a refill on a prescription and MD is going to transition her to another one soon. She has noticed that she has increased back pain riding in the car, feels like she needs to get up on her feet often. Her back pain is continuing to resolve with bowel movements, she states her MD is not sending her to a specialist yet. Patient rates herself as being a 98/100 on a subjective scale.    Pertinent History anxiety, OA, RA, HNT,  , depression     How long can you sit comfortably? 9/7- unlimited    How long can you stand comfortably? 9/7- not a clear answer   How long can you walk comfortably? 9/7- goes to mailbox, limited more by fatigue than pain, also reports concern of falling walking her dog    Patient Stated Goals less pain    Currently in Pain? Yes  unable to clearly rate/describe    Pain Score --  unable to clearly rate/describe             [facility] PT  Assessment - 01/03/16 0001      Assessment   Medical Diagnosis Low back pain   Referring Provider Lucianne Lei    Onset Date/Surgical Date 10/13/15   Next MD Visit unknown      Balance Screen   Has the patient fallen in the past 6 months No   Has the patient had a decrease in activity level because of a fear of falling?  Yes   Is the patient reluctant to leave their home because of a fear of falling?  No     Prior Function   Level of Independence Independent with community mobility with device   Vocation Retired     AROM   Lumbar Flexion 81   Lumbar Extension 11   Lumbar - Right Side Bend 14   Lumbar - Left Side Bend 16     Strength   Right Hip Extension 2/5   Right Hip ABduction 3-/5   Left Hip Extension 2/5   Left Hip ABduction 2+/5   Right Knee Extension 3+/5   Left Knee Extension 3/5   Right Ankle Dorsiflexion 4+/5   Left Ankle Dorsiflexion 4+/5     Palpation   Palpation comment tenderness noted bilateral lateral trunk, bilateral lumbar  paraspinals      6 minute walk test results    Aerobic Endurance Distance Walked 256   Endurance additional comments 3MWT      High Level Balance   High Level Balance Comments SLS 1-2 seconds best                              PT Education - 01/03/16 1246    Education Details progress with skilled PT services, POC moving foward    Person(s) Educated Patient   Methods Explanation   Comprehension Verbalized understanding          PT Short Term Goals - 01/03/16 0934      PT SHORT TERM GOAL #1   Title Pt to be independent in low back exercises to decrease her pain to no greater than a 3/10 to decrease use of pain medication    Baseline 9/7- unclear answer, reports she is not doing them like she is supposed to    Time 4   Period Weeks   Status On-going     PT SHORT TERM GOAL #2   Title Pt core and lower extremity strength to be increased by one grade to allow pt to be able to walk for 30 minutes at a  time to be able to complete short shopping trips with confidence with a cane    Baseline 9/7- no significant improvemnts in strength, reports she can walk 30 minutes    Time 4   Period Weeks   Status Partially Met     PT SHORT TERM GOAL #3   Title Pt to be able to stand for 15 minutes to make a small meal and clean up without having increased low back pain    Baseline 9/7- patient reports she is able to do so    Time 4   Period Weeks   Status Achieved     PT SHORT TERM GOAL #4   Title Pt to be able to come sit to stand without having increased low back pain    Baseline 9/7- continues to have pain in the morning but not throughout the day, mostly just stiff    Time 4   Period Weeks   Status Achieved           PT Long Term Goals - 01/03/16 0936      PT LONG TERM GOAL #1   Title Pt to be independent in advanced HEP to decrease back pain to a 1/10  so that patient does not have to take any medication for her back pain    Baseline 9/7- poor compliance    Time 8   Period Weeks   Status On-going     PT LONG TERM GOAL #2   Title Pt core and LE strength to be increased by two levels to allow patient to be able to ascend and descend 5 steps without increased low back pain    Baseline 9/7 no significant improvements    Time 8   Period Weeks   Status On-going     PT LONG TERM GOAL #3   Title Pt to be able to walk in her yard with her cane with confidence and no low back pain    Baseline 9/7- limited by dizziness rather than pain    Time 8   Period Weeks   Status Partially Met  Plan - 02/02/2016 1247    Clinical Impression Statement Re-assessment performed today. Patient does not show significant objective changes at this time, however does report subjective improvement and states that she feels 98/100 on a subjective scale; per her descriptions, the majority of her impairments appear possibly related more to other impairments, such as poor bowel movements and  medication side effects at this time. Patient is not compliant with her assigned HEP at this time. She does appear to have improved somewhat with skilled PT services; efficacy of sessions has been limited by patient being very talkative and requiring a high level of cues to remain focused. Recommend one more skilled session to focus on safety and balance training before likely DC.    Rehab Potential Fair   Clinical Impairments Affecting Rehab Potential Suspect some cognitive impairment that may limit independence in HEP at home and continued self care s/p DC.    PT Frequency Other (comment)  one more visit    PT Duration Other (comment)  one more session    PT Treatment/Interventions ADLs/Self Care Home Management;Moist Heat;Ultrasound;Gait training;Stair training;DME Instruction;Functional mobility training;Therapeutic activities;Therapeutic exercise;Balance training;Neuromuscular re-education;Patient/family education;Manual techniques   PT Next Visit Plan last session to focus on balance and safety training    PT Home Exercise Plan given    Consulted and Agree with Plan of Care Patient      Patient will benefit from skilled therapeutic intervention in order to improve the following deficits and impairments:  Abnormal gait, Decreased activity tolerance, Decreased balance, Decreased strength, Decreased range of motion, Difficulty walking, Pain, Increased edema, Postural dysfunction  Visit Diagnosis: Bilateral low back pain without sciatica - Plan: PT plan of care cert/re-cert  Unsteadiness on feet - Plan: PT plan of care cert/re-cert  Muscle weakness (generalized) - Plan: PT plan of care cert/re-cert       G-Codes - 2016-02-02 1248    Functional Assessment Tool Used Based on skilled clinical assessment of ROM, strength, posture, pain patterns    Functional Limitation Mobility: Walking and moving around   Mobility: Walking and Moving Around Current Status (R6789) At least 40 percent but  less than 60 percent impaired, limited or restricted   Mobility: Walking and Moving Around Goal Status (F8101) At least 40 percent but less than 60 percent impaired, limited or restricted      Problem List There are no active problems to display for this patient.   [name], DPT [phone]  [place] [address] [place], [place], [address] Phone: [phone]   Fax:  [phone]  Name: Alexis Ford MRN: [id number] Date of Birth: 04-20-1934

## 2016-01-07 ENCOUNTER — Ambulatory Visit (HOSPITAL_COMMUNITY): Payer: Medicare Other | Admitting: Physical Therapy

## 2016-01-07 DIAGNOSIS — R2681 Unsteadiness on feet: Secondary | ICD-10-CM | POA: Diagnosis not present

## 2016-01-07 DIAGNOSIS — M545 Low back pain, unspecified: Secondary | ICD-10-CM

## 2016-01-07 DIAGNOSIS — M6281 Muscle weakness (generalized): Secondary | ICD-10-CM | POA: Diagnosis not present

## 2016-01-07 NOTE — Therapy (Signed)
[place], [place], [address] Phone: [phone]   Fax:  [phone]  Physical Therapy Treatment (Discharge)  Patient Details  Name: Alexis Ford MRN: [id number] Date of Birth: [birth year] Referring Provider: Lucianne Lei   Encounter Date: 01/07/2016      PT End of Session - 01/07/16 1041    Visit Number 14   Number of Visits 14   Authorization Type medicare (G-codes done 14th session)   Authorization - Visit Number 14   Authorization - Number of Visits 23   PT Start Time 8250   PT Stop Time 1029   PT Time Calculation (min) 42 min   Activity Tolerance Patient tolerated treatment well   Behavior During Therapy [facility] for tasks assessed/performed;Anxious      Past Medical History:  Diagnosis Date  . Anxiety   . Arthritis   . Depression   . Hypertension     No past surgical history on file.  There were no vitals filed for this visit.      Subjective Assessment - 01/07/16 0949    Subjective Patient arrives today stating she is trying to get someone to stay with her at home overnights; it can take her an hour or so to get going in the mornings, she also is getting up a lot at night to use the restroom. She is still having some pain in her back, more on the sides than in her paraspinals; she has had her MD feel this and got some medicine for it as well.    Pertinent History anxiety, OA, RA, HNT,  , depression     Patient Stated Goals less pain    Currently in Pain? Yes   Pain Score --  unable to rate pain specifically, states its "in the middle"    Pain Location Other (Comment)  sides/mid back    Pain Orientation Right;Left   Pain Descriptors / Indicators Discomfort   Pain Type Chronic pain   Pain Radiating Towards unable to describe    Pain Onset More than a month ago   Pain Frequency Intermittent   Aggravating Factors  unable to answer effectively    Pain Relieving Factors unable to answer effectively     Effect of Pain on Daily Activities unknown                          [facility] Adult PT Treatment/Exercise - 01/07/16 0001      Therapeutic Activites    Therapeutic Activities Other Therapeutic Activities   Other Therapeutic Activities proper bed mechanics      Lumbar Exercises: Stretches   Single Knee to Chest Stretch 2 reps;10 seconds   Lower Trunk Rotation 2 reps;10 seconds                PT Education - 01/07/16 1229    Education provided Yes   Education Details extensive education regarding exercises to do before getting out of bed, importance of keeping up with HEP; contact info for private duty care agencies and possible benefits of having someone stay overnight iwth her; improtance of proper hydration; patient right to seek second opinion from MD; DC today    Person(s) Educated Patient   Methods Explanation;Handout   Comprehension Verbalized understanding          PT Short Term Goals - 01/03/16 0934      PT SHORT TERM GOAL #1   Title Pt to  be independent in low back exercises to decrease her pain to no greater than a 3/10 to decrease use of pain medication    Baseline 9/7- unclear answer, reports she is not doing them like she is supposed to    Time 4   Period Weeks   Status On-going     PT SHORT TERM GOAL #2   Title Pt core and lower extremity strength to be increased by one grade to allow pt to be able to walk for 30 minutes at a time to be able to complete short shopping trips with confidence with a cane    Baseline 9/7- no significant improvemnts in strength, reports she can walk 30 minutes    Time 4   Period Weeks   Status Partially Met     PT SHORT TERM GOAL #3   Title Pt to be able to stand for 15 minutes to make a small meal and clean up without having increased low back pain    Baseline 9/7- patient reports she is able to do so    Time 4   Period Weeks   Status Achieved     PT SHORT TERM GOAL #4   Title Pt to be able to come  sit to stand without having increased low back pain    Baseline 9/7- continues to have pain in the morning but not throughout the day, mostly just stiff    Time 4   Period Weeks   Status Achieved           PT Long Term Goals - 01/03/16 0936      PT LONG TERM GOAL #1   Title Pt to be independent in advanced HEP to decrease back pain to a 1/10  so that patient does not have to take any medication for her back pain    Baseline 9/7- poor compliance    Time 8   Period Weeks   Status On-going     PT LONG TERM GOAL #2   Title Pt core and LE strength to be increased by two levels to allow patient to be able to ascend and descend 5 steps without increased low back pain    Baseline 9/7 no significant improvements    Time 8   Period Weeks   Status On-going     PT LONG TERM GOAL #3   Title Pt to be able to walk in her yard with her cane with confidence and no low back pain    Baseline 9/7- limited by dizziness rather than pain    Time 8   Period Weeks   Status Partially Met               Plan - 01/07/16 1231    Clinical Impression Statement Patient arrives today stating that she has been having acute pains, pointing to her sides in general as well as high lumbar/low thoracic spine; upon palpation, patient seems to be most tender in upper lumbar/thoracic and laterally on her back. Recommended that patient perform lumbar mobility exercises in bed prior to getting up in the mornings; she states her last bowel movement was the day after yesterday and she reports that she really is not drinking a lot of water. Session continues to be quite limited due to patient's poor focus and talkativeness, requires frequent cues for re-focus and redirection during session; unable to perform many therapeutic activities or planned balance activities today due to time spent redirecting and educating during session. Continue to  recommend that patient see MD regarding pain as it does appear to be related to  bowels in frequency/pain relieving factors.  Reviewed correct bed mobility, also provided list of private duty care agencies for assistance at home. DC today due to general lack of progress.    Rehab Potential Fair   Clinical Impairments Affecting Rehab Potential Suspect some cognitive impairment that may limit independence in HEP at home and continued self care s/p DC.    PT Treatment/Interventions ADLs/Self Care Home Management;Moist Heat;Ultrasound;Gait training;Stair training;DME Instruction;Functional mobility training;Therapeutic activities;Therapeutic exercise;Balance training;Neuromuscular re-education;Patient/family education;Manual techniques   PT Next Visit Plan DC today due to lack of progress    Consulted and Agree with Plan of Care Patient      Patient will benefit from skilled therapeutic intervention in order to improve the following deficits and impairments:  Abnormal gait, Decreased activity tolerance, Decreased balance, Decreased strength, Decreased range of motion, Difficulty walking, Pain, Increased edema, Postural dysfunction  Visit Diagnosis: Bilateral low back pain without sciatica  Unsteadiness on feet  Muscle weakness (generalized)       G-Codes - 01/26/16 1231    Functional Assessment Tool Used Based on skilled clinical assessment of ROM, strength, posture, pain patterns    Functional Limitation Mobility: Walking and moving around   Mobility: Walking and Moving Around Goal Status [phone]) At least 40 percent but less than 60 percent impaired, limited or restricted   Mobility: Walking and Moving Around Discharge Status [phone]) At least 40 percent but less than 60 percent impaired, limited or restricted      Problem List There are no active problems to display for this patient.  PHYSICAL THERAPY DISCHARGE SUMMARY  Visits from Start of Care: 14  Current functional level related to goals / functional outcomes: Throughout skilled PT services patient has been  very talkative, requiring frequent re-direction for focus on exercises; while patient appears to have experienced some subjective improvements, noted limited changes in objective measures taken at time of most recent re-assessment. DC today due to general lack of progress.    Remaining deficits: Back pain, unsteadiness, functional weakness, postural deficits    Education / Equipment: DC today, home exercises, private duty care organizations  Plan: Patient agrees to discharge.  Patient goals were partially met. Patient is being discharged due to lack of progress.  ?????      [name], DPT [place] [address] [place], [place], [address] Phone: [phone]   Fax:  [phone]  Name: RASHEENA TALMADGE MRN: [id number] Date of Birth: 02/10/1934

## 2016-01-07 NOTE — Patient Instructions (Signed)
   SINGLE KNEE TO CHEST STRETCH - [place]  While Lying on your back, hold your knee and gently pull it up towards your chest.  Hold for 5 seconds, and repeat 5 times each side.     Lumbar Rotations   Lying on your back with your knees bent, slowly drop your legs to one side and hold the stretch.   Hold this for 5 seconds, 5 times each side.

## 2016-02-06 DIAGNOSIS — B182 Chronic viral hepatitis C: Secondary | ICD-10-CM | POA: Diagnosis not present

## 2016-02-06 DIAGNOSIS — I10 Essential (primary) hypertension: Secondary | ICD-10-CM | POA: Diagnosis not present

## 2016-02-06 DIAGNOSIS — D649 Anemia, unspecified: Secondary | ICD-10-CM | POA: Diagnosis not present

## 2016-02-06 DIAGNOSIS — Z23 Encounter for immunization: Secondary | ICD-10-CM | POA: Diagnosis not present

## 2016-02-06 DIAGNOSIS — F250 Schizoaffective disorder, bipolar type: Secondary | ICD-10-CM | POA: Diagnosis not present

## 2016-02-06 DIAGNOSIS — R2243 Localized swelling, mass and lump, lower limb, bilateral: Secondary | ICD-10-CM | POA: Diagnosis not present

## 2016-02-12 ENCOUNTER — Telehealth: Payer: Self-pay | Admitting: *Deleted

## 2016-02-12 NOTE — Telephone Encounter (Signed)
Dr Fransico Setters office is aware that the patient has not been seen.  The patient actually moved away and stayed with one of her daughters.  Even Dr. Fransico Setters office has a difficult time getting her to her office.  If the referral is discussed with the patient and one of her daughters in the future they may re-refer the patient.

## 2016-03-12 DIAGNOSIS — R069 Unspecified abnormalities of breathing: Secondary | ICD-10-CM | POA: Diagnosis not present

## 2016-03-12 DIAGNOSIS — B162 Acute hepatitis B without delta-agent with hepatic coma: Secondary | ICD-10-CM | POA: Diagnosis not present

## 2016-03-12 DIAGNOSIS — M130 Polyarthritis, unspecified: Secondary | ICD-10-CM | POA: Diagnosis not present

## 2016-03-12 DIAGNOSIS — E876 Hypokalemia: Secondary | ICD-10-CM | POA: Diagnosis not present

## 2016-03-12 DIAGNOSIS — F250 Schizoaffective disorder, bipolar type: Secondary | ICD-10-CM | POA: Diagnosis not present

## 2016-03-12 DIAGNOSIS — R2681 Unsteadiness on feet: Secondary | ICD-10-CM | POA: Diagnosis not present

## 2016-03-12 DIAGNOSIS — R609 Edema, unspecified: Secondary | ICD-10-CM | POA: Diagnosis not present

## 2016-03-12 DIAGNOSIS — I10 Essential (primary) hypertension: Secondary | ICD-10-CM | POA: Diagnosis not present

## 2016-05-12 DIAGNOSIS — I10 Essential (primary) hypertension: Secondary | ICD-10-CM | POA: Diagnosis not present

## 2016-05-12 DIAGNOSIS — F250 Schizoaffective disorder, bipolar type: Secondary | ICD-10-CM | POA: Diagnosis not present

## 2016-05-12 DIAGNOSIS — B182 Chronic viral hepatitis C: Secondary | ICD-10-CM | POA: Diagnosis not present

## 2016-06-13 DIAGNOSIS — F339 Major depressive disorder, recurrent, unspecified: Secondary | ICD-10-CM | POA: Diagnosis not present

## 2016-08-22 DIAGNOSIS — I10 Essential (primary) hypertension: Secondary | ICD-10-CM | POA: Diagnosis not present

## 2016-08-22 DIAGNOSIS — M130 Polyarthritis, unspecified: Secondary | ICD-10-CM | POA: Diagnosis not present

## 2016-08-22 DIAGNOSIS — R609 Edema, unspecified: Secondary | ICD-10-CM | POA: Diagnosis not present

## 2016-08-22 DIAGNOSIS — B182 Chronic viral hepatitis C: Secondary | ICD-10-CM | POA: Diagnosis not present

## 2016-08-22 DIAGNOSIS — F250 Schizoaffective disorder, bipolar type: Secondary | ICD-10-CM | POA: Diagnosis not present

## 2016-10-01 DIAGNOSIS — F339 Major depressive disorder, recurrent, unspecified: Secondary | ICD-10-CM | POA: Diagnosis not present

## 2016-10-01 DIAGNOSIS — M130 Polyarthritis, unspecified: Secondary | ICD-10-CM | POA: Diagnosis not present

## 2016-10-01 DIAGNOSIS — B182 Chronic viral hepatitis C: Secondary | ICD-10-CM | POA: Diagnosis not present

## 2016-10-01 DIAGNOSIS — R1030 Lower abdominal pain, unspecified: Secondary | ICD-10-CM | POA: Diagnosis not present

## 2016-10-01 DIAGNOSIS — R609 Edema, unspecified: Secondary | ICD-10-CM | POA: Diagnosis not present

## 2016-11-25 DIAGNOSIS — M5136 Other intervertebral disc degeneration, lumbar region: Secondary | ICD-10-CM | POA: Diagnosis not present

## 2016-11-25 DIAGNOSIS — I700 Atherosclerosis of aorta: Secondary | ICD-10-CM | POA: Diagnosis not present

## 2016-11-25 DIAGNOSIS — Z79899 Other long term (current) drug therapy: Secondary | ICD-10-CM | POA: Diagnosis not present

## 2016-11-25 DIAGNOSIS — M160 Bilateral primary osteoarthritis of hip: Secondary | ICD-10-CM | POA: Diagnosis not present

## 2016-11-25 DIAGNOSIS — R109 Unspecified abdominal pain: Secondary | ICD-10-CM | POA: Diagnosis not present

## 2016-11-25 DIAGNOSIS — F17210 Nicotine dependence, cigarettes, uncomplicated: Secondary | ICD-10-CM | POA: Diagnosis not present

## 2016-12-16 DIAGNOSIS — R1030 Lower abdominal pain, unspecified: Secondary | ICD-10-CM | POA: Diagnosis not present

## 2016-12-16 DIAGNOSIS — B182 Chronic viral hepatitis C: Secondary | ICD-10-CM | POA: Diagnosis not present

## 2016-12-16 DIAGNOSIS — I10 Essential (primary) hypertension: Secondary | ICD-10-CM | POA: Diagnosis not present

## 2016-12-16 DIAGNOSIS — M130 Polyarthritis, unspecified: Secondary | ICD-10-CM | POA: Diagnosis not present

## 2016-12-16 DIAGNOSIS — F250 Schizoaffective disorder, bipolar type: Secondary | ICD-10-CM | POA: Diagnosis not present

## 2017-01-19 DIAGNOSIS — M130 Polyarthritis, unspecified: Secondary | ICD-10-CM | POA: Diagnosis not present

## 2017-01-19 DIAGNOSIS — I10 Essential (primary) hypertension: Secondary | ICD-10-CM | POA: Diagnosis not present

## 2017-01-19 DIAGNOSIS — F339 Major depressive disorder, recurrent, unspecified: Secondary | ICD-10-CM | POA: Diagnosis not present

## 2017-01-19 DIAGNOSIS — F250 Schizoaffective disorder, bipolar type: Secondary | ICD-10-CM | POA: Diagnosis not present

## 2017-01-19 DIAGNOSIS — B182 Chronic viral hepatitis C: Secondary | ICD-10-CM | POA: Diagnosis not present

## 2017-03-26 DIAGNOSIS — M25551 Pain in right hip: Secondary | ICD-10-CM | POA: Diagnosis not present

## 2017-03-26 DIAGNOSIS — M545 Low back pain: Secondary | ICD-10-CM | POA: Diagnosis not present

## 2017-03-31 DIAGNOSIS — M25551 Pain in right hip: Secondary | ICD-10-CM | POA: Diagnosis not present

## 2017-03-31 DIAGNOSIS — M545 Low back pain: Secondary | ICD-10-CM | POA: Diagnosis not present

## 2017-04-02 DIAGNOSIS — M545 Low back pain: Secondary | ICD-10-CM | POA: Diagnosis not present

## 2017-04-02 DIAGNOSIS — M25551 Pain in right hip: Secondary | ICD-10-CM | POA: Diagnosis not present

## 2017-04-07 DIAGNOSIS — M25551 Pain in right hip: Secondary | ICD-10-CM | POA: Diagnosis not present

## 2017-04-07 DIAGNOSIS — M545 Low back pain: Secondary | ICD-10-CM | POA: Diagnosis not present

## 2017-04-09 DIAGNOSIS — M545 Low back pain: Secondary | ICD-10-CM | POA: Diagnosis not present

## 2017-04-09 DIAGNOSIS — M25551 Pain in right hip: Secondary | ICD-10-CM | POA: Diagnosis not present

## 2017-05-15 DIAGNOSIS — I10 Essential (primary) hypertension: Secondary | ICD-10-CM | POA: Diagnosis not present

## 2017-05-15 DIAGNOSIS — F339 Major depressive disorder, recurrent, unspecified: Secondary | ICD-10-CM | POA: Diagnosis not present

## 2017-06-23 DIAGNOSIS — M1651 Unilateral post-traumatic osteoarthritis, right hip: Secondary | ICD-10-CM | POA: Diagnosis not present

## 2017-06-23 DIAGNOSIS — M170 Bilateral primary osteoarthritis of knee: Secondary | ICD-10-CM | POA: Diagnosis not present

## 2017-06-23 DIAGNOSIS — M25551 Pain in right hip: Secondary | ICD-10-CM | POA: Diagnosis not present

## 2017-06-26 DIAGNOSIS — M25551 Pain in right hip: Secondary | ICD-10-CM | POA: Diagnosis not present

## 2017-07-22 DIAGNOSIS — R1011 Right upper quadrant pain: Secondary | ICD-10-CM | POA: Diagnosis not present

## 2017-07-29 DIAGNOSIS — K8020 Calculus of gallbladder without cholecystitis without obstruction: Secondary | ICD-10-CM | POA: Diagnosis not present

## 2017-07-29 DIAGNOSIS — R1011 Right upper quadrant pain: Secondary | ICD-10-CM | POA: Diagnosis not present

## 2017-08-07 DIAGNOSIS — I10 Essential (primary) hypertension: Secondary | ICD-10-CM | POA: Diagnosis not present

## 2017-08-07 DIAGNOSIS — B182 Chronic viral hepatitis C: Secondary | ICD-10-CM | POA: Diagnosis not present

## 2017-08-07 DIAGNOSIS — D649 Anemia, unspecified: Secondary | ICD-10-CM | POA: Diagnosis not present

## 2017-08-07 DIAGNOSIS — M130 Polyarthritis, unspecified: Secondary | ICD-10-CM | POA: Diagnosis not present

## 2017-08-07 DIAGNOSIS — R2243 Localized swelling, mass and lump, lower limb, bilateral: Secondary | ICD-10-CM | POA: Diagnosis not present

## 2017-08-17 DIAGNOSIS — R1011 Right upper quadrant pain: Secondary | ICD-10-CM | POA: Diagnosis not present

## 2017-09-03 DIAGNOSIS — F17210 Nicotine dependence, cigarettes, uncomplicated: Secondary | ICD-10-CM | POA: Diagnosis not present

## 2017-09-03 DIAGNOSIS — R1011 Right upper quadrant pain: Secondary | ICD-10-CM | POA: Diagnosis not present

## 2017-09-03 DIAGNOSIS — K3189 Other diseases of stomach and duodenum: Secondary | ICD-10-CM | POA: Diagnosis not present

## 2017-09-03 DIAGNOSIS — R1031 Right lower quadrant pain: Secondary | ICD-10-CM | POA: Diagnosis not present

## 2017-09-03 DIAGNOSIS — K259 Gastric ulcer, unspecified as acute or chronic, without hemorrhage or perforation: Secondary | ICD-10-CM | POA: Diagnosis not present

## 2017-09-03 DIAGNOSIS — K2970 Gastritis, unspecified, without bleeding: Secondary | ICD-10-CM | POA: Diagnosis not present

## 2017-09-03 DIAGNOSIS — B1920 Unspecified viral hepatitis C without hepatic coma: Secondary | ICD-10-CM | POA: Diagnosis not present

## 2017-09-03 DIAGNOSIS — K7460 Unspecified cirrhosis of liver: Secondary | ICD-10-CM | POA: Diagnosis not present

## 2017-09-03 DIAGNOSIS — K449 Diaphragmatic hernia without obstruction or gangrene: Secondary | ICD-10-CM | POA: Diagnosis not present

## 2017-09-14 DIAGNOSIS — F339 Major depressive disorder, recurrent, unspecified: Secondary | ICD-10-CM | POA: Diagnosis not present

## 2017-10-28 DIAGNOSIS — I10 Essential (primary) hypertension: Secondary | ICD-10-CM | POA: Diagnosis not present

## 2017-12-22 DIAGNOSIS — B182 Chronic viral hepatitis C: Secondary | ICD-10-CM | POA: Diagnosis not present

## 2017-12-24 DIAGNOSIS — M25551 Pain in right hip: Secondary | ICD-10-CM | POA: Diagnosis not present

## 2017-12-24 DIAGNOSIS — M1651 Unilateral post-traumatic osteoarthritis, right hip: Secondary | ICD-10-CM | POA: Diagnosis not present

## 2017-12-24 DIAGNOSIS — M25561 Pain in right knee: Secondary | ICD-10-CM | POA: Diagnosis not present

## 2017-12-24 DIAGNOSIS — M170 Bilateral primary osteoarthritis of knee: Secondary | ICD-10-CM | POA: Diagnosis not present

## 2017-12-25 DIAGNOSIS — B182 Chronic viral hepatitis C: Secondary | ICD-10-CM | POA: Diagnosis not present

## 2018-01-05 DIAGNOSIS — J42 Unspecified chronic bronchitis: Secondary | ICD-10-CM | POA: Diagnosis not present

## 2018-01-05 DIAGNOSIS — I368 Other nonrheumatic tricuspid valve disorders: Secondary | ICD-10-CM | POA: Diagnosis not present

## 2018-01-05 DIAGNOSIS — R079 Chest pain, unspecified: Secondary | ICD-10-CM | POA: Diagnosis not present

## 2018-01-05 DIAGNOSIS — Z720 Tobacco use: Secondary | ICD-10-CM | POA: Diagnosis not present

## 2018-01-05 DIAGNOSIS — F419 Anxiety disorder, unspecified: Secondary | ICD-10-CM | POA: Diagnosis not present

## 2018-01-05 DIAGNOSIS — M25461 Effusion, right knee: Secondary | ICD-10-CM | POA: Diagnosis not present

## 2018-01-05 DIAGNOSIS — B182 Chronic viral hepatitis C: Secondary | ICD-10-CM | POA: Diagnosis not present

## 2018-01-05 DIAGNOSIS — R0789 Other chest pain: Secondary | ICD-10-CM | POA: Diagnosis not present

## 2018-01-05 DIAGNOSIS — M7989 Other specified soft tissue disorders: Secondary | ICD-10-CM | POA: Diagnosis not present

## 2018-01-05 DIAGNOSIS — M1611 Unilateral primary osteoarthritis, right hip: Secondary | ICD-10-CM | POA: Diagnosis not present

## 2018-01-05 DIAGNOSIS — F329 Major depressive disorder, single episode, unspecified: Secondary | ICD-10-CM | POA: Diagnosis not present

## 2018-01-05 DIAGNOSIS — I10 Essential (primary) hypertension: Secondary | ICD-10-CM | POA: Diagnosis not present

## 2018-01-05 DIAGNOSIS — Z0389 Encounter for observation for other suspected diseases and conditions ruled out: Secondary | ICD-10-CM | POA: Diagnosis not present

## 2018-01-05 DIAGNOSIS — R7881 Bacteremia: Secondary | ICD-10-CM | POA: Diagnosis not present

## 2018-01-05 DIAGNOSIS — I361 Nonrheumatic tricuspid (valve) insufficiency: Secondary | ICD-10-CM | POA: Diagnosis not present

## 2018-01-06 DIAGNOSIS — M7989 Other specified soft tissue disorders: Secondary | ICD-10-CM | POA: Diagnosis not present

## 2018-01-06 DIAGNOSIS — M1611 Unilateral primary osteoarthritis, right hip: Secondary | ICD-10-CM | POA: Diagnosis not present

## 2018-01-06 DIAGNOSIS — M25461 Effusion, right knee: Secondary | ICD-10-CM | POA: Diagnosis not present

## 2018-01-06 DIAGNOSIS — Z720 Tobacco use: Secondary | ICD-10-CM | POA: Diagnosis not present

## 2018-01-06 DIAGNOSIS — I368 Other nonrheumatic tricuspid valve disorders: Secondary | ICD-10-CM | POA: Diagnosis not present

## 2018-01-06 DIAGNOSIS — I10 Essential (primary) hypertension: Secondary | ICD-10-CM | POA: Diagnosis not present

## 2018-01-06 DIAGNOSIS — R0789 Other chest pain: Secondary | ICD-10-CM | POA: Diagnosis not present

## 2018-01-07 DIAGNOSIS — I10 Essential (primary) hypertension: Secondary | ICD-10-CM | POA: Diagnosis not present

## 2018-01-07 DIAGNOSIS — R0789 Other chest pain: Secondary | ICD-10-CM | POA: Diagnosis not present

## 2018-01-07 DIAGNOSIS — Z720 Tobacco use: Secondary | ICD-10-CM | POA: Diagnosis not present

## 2018-01-07 DIAGNOSIS — I368 Other nonrheumatic tricuspid valve disorders: Secondary | ICD-10-CM | POA: Diagnosis not present

## 2018-01-08 DIAGNOSIS — J42 Unspecified chronic bronchitis: Secondary | ICD-10-CM | POA: Diagnosis present

## 2018-01-08 DIAGNOSIS — M25551 Pain in right hip: Secondary | ICD-10-CM | POA: Diagnosis present

## 2018-01-08 DIAGNOSIS — M238X1 Other internal derangements of right knee: Secondary | ICD-10-CM | POA: Diagnosis present

## 2018-01-08 DIAGNOSIS — Z90710 Acquired absence of both cervix and uterus: Secondary | ICD-10-CM | POA: Diagnosis not present

## 2018-01-08 DIAGNOSIS — B182 Chronic viral hepatitis C: Secondary | ICD-10-CM | POA: Diagnosis present

## 2018-01-08 DIAGNOSIS — Z0389 Encounter for observation for other suspected diseases and conditions ruled out: Secondary | ICD-10-CM | POA: Diagnosis not present

## 2018-01-08 DIAGNOSIS — I071 Rheumatic tricuspid insufficiency: Secondary | ICD-10-CM | POA: Diagnosis present

## 2018-01-08 DIAGNOSIS — R7881 Bacteremia: Secondary | ICD-10-CM | POA: Diagnosis not present

## 2018-01-08 DIAGNOSIS — Z79899 Other long term (current) drug therapy: Secondary | ICD-10-CM | POA: Diagnosis not present

## 2018-01-08 DIAGNOSIS — E669 Obesity, unspecified: Secondary | ICD-10-CM | POA: Diagnosis present

## 2018-01-08 DIAGNOSIS — M79651 Pain in right thigh: Secondary | ICD-10-CM | POA: Diagnosis present

## 2018-01-08 DIAGNOSIS — F329 Major depressive disorder, single episode, unspecified: Secondary | ICD-10-CM | POA: Diagnosis present

## 2018-01-08 DIAGNOSIS — Q248 Other specified congenital malformations of heart: Secondary | ICD-10-CM | POA: Diagnosis not present

## 2018-01-08 DIAGNOSIS — Z6830 Body mass index (BMI) 30.0-30.9, adult: Secondary | ICD-10-CM | POA: Diagnosis not present

## 2018-01-08 DIAGNOSIS — M1990 Unspecified osteoarthritis, unspecified site: Secondary | ICD-10-CM | POA: Diagnosis present

## 2018-01-08 DIAGNOSIS — I330 Acute and subacute infective endocarditis: Secondary | ICD-10-CM | POA: Diagnosis not present

## 2018-01-08 DIAGNOSIS — Z720 Tobacco use: Secondary | ICD-10-CM | POA: Diagnosis not present

## 2018-01-08 DIAGNOSIS — F419 Anxiety disorder, unspecified: Secondary | ICD-10-CM | POA: Diagnosis present

## 2018-01-08 DIAGNOSIS — Z23 Encounter for immunization: Secondary | ICD-10-CM | POA: Diagnosis not present

## 2018-01-08 DIAGNOSIS — R0789 Other chest pain: Secondary | ICD-10-CM | POA: Diagnosis present

## 2018-01-08 DIAGNOSIS — I10 Essential (primary) hypertension: Secondary | ICD-10-CM | POA: Diagnosis present

## 2018-01-08 DIAGNOSIS — F17210 Nicotine dependence, cigarettes, uncomplicated: Secondary | ICD-10-CM | POA: Diagnosis present

## 2018-01-08 DIAGNOSIS — Z7189 Other specified counseling: Secondary | ICD-10-CM | POA: Diagnosis not present

## 2018-01-08 DIAGNOSIS — M810 Age-related osteoporosis without current pathological fracture: Secondary | ICD-10-CM | POA: Diagnosis present

## 2018-01-22 DIAGNOSIS — M130 Polyarthritis, unspecified: Secondary | ICD-10-CM | POA: Diagnosis not present

## 2018-02-22 ENCOUNTER — Emergency Department (HOSPITAL_COMMUNITY): Payer: Medicare Other

## 2018-02-22 ENCOUNTER — Observation Stay (HOSPITAL_COMMUNITY): Payer: Medicare Other

## 2018-02-22 ENCOUNTER — Encounter (HOSPITAL_COMMUNITY): Payer: Self-pay | Admitting: Emergency Medicine

## 2018-02-22 ENCOUNTER — Other Ambulatory Visit: Payer: Self-pay

## 2018-02-22 ENCOUNTER — Observation Stay (HOSPITAL_COMMUNITY)
Admission: EM | Admit: 2018-02-22 | Discharge: 2018-02-26 | Disposition: A | Discharge status: Home / Self Care | Payer: Medicare Other | Attending: Internal Medicine | Admitting: Internal Medicine

## 2018-02-22 DIAGNOSIS — R2689 Other abnormalities of gait and mobility: Secondary | ICD-10-CM | POA: Insufficient documentation

## 2018-02-22 DIAGNOSIS — M6281 Muscle weakness (generalized): Secondary | ICD-10-CM | POA: Diagnosis not present

## 2018-02-22 DIAGNOSIS — I129 Hypertensive chronic kidney disease with stage 1 through stage 4 chronic kidney disease, or unspecified chronic kidney disease: Secondary | ICD-10-CM | POA: Diagnosis not present

## 2018-02-22 DIAGNOSIS — Z7984 Long term (current) use of oral hypoglycemic drugs: Secondary | ICD-10-CM | POA: Insufficient documentation

## 2018-02-22 DIAGNOSIS — D649 Anemia, unspecified: Secondary | ICD-10-CM | POA: Diagnosis present

## 2018-02-22 DIAGNOSIS — F339 Major depressive disorder, recurrent, unspecified: Secondary | ICD-10-CM | POA: Diagnosis not present

## 2018-02-22 DIAGNOSIS — N289 Disorder of kidney and ureter, unspecified: Secondary | ICD-10-CM | POA: Diagnosis not present

## 2018-02-22 DIAGNOSIS — R531 Weakness: Secondary | ICD-10-CM | POA: Diagnosis not present

## 2018-02-22 DIAGNOSIS — R4182 Altered mental status, unspecified: Secondary | ICD-10-CM

## 2018-02-22 DIAGNOSIS — I6522 Occlusion and stenosis of left carotid artery: Secondary | ICD-10-CM | POA: Diagnosis not present

## 2018-02-22 DIAGNOSIS — Z79899 Other long term (current) drug therapy: Secondary | ICD-10-CM | POA: Insufficient documentation

## 2018-02-22 DIAGNOSIS — N179 Acute kidney failure, unspecified: Secondary | ICD-10-CM | POA: Diagnosis present

## 2018-02-22 DIAGNOSIS — R2681 Unsteadiness on feet: Secondary | ICD-10-CM | POA: Insufficient documentation

## 2018-02-22 DIAGNOSIS — N189 Chronic kidney disease, unspecified: Secondary | ICD-10-CM | POA: Diagnosis not present

## 2018-02-22 DIAGNOSIS — R200 Anesthesia of skin: Secondary | ICD-10-CM | POA: Diagnosis not present

## 2018-02-22 DIAGNOSIS — N390 Urinary tract infection, site not specified: Secondary | ICD-10-CM | POA: Diagnosis not present

## 2018-02-22 DIAGNOSIS — I10 Essential (primary) hypertension: Secondary | ICD-10-CM | POA: Diagnosis present

## 2018-02-22 LAB — URINALYSIS, ROUTINE W REFLEX MICROSCOPIC
Bilirubin Urine: NEGATIVE
GLUCOSE, UA: NEGATIVE mg/dL
Ketones, ur: NEGATIVE mg/dL
NITRITE: NEGATIVE
Protein, ur: NEGATIVE mg/dL
Specific Gravity, Urine: 1.023 (ref 1.005–1.030)
pH: 5 (ref 5.0–8.0)

## 2018-02-22 LAB — BASIC METABOLIC PANEL
ANION GAP: 8 (ref 5–15)
BUN: 17 mg/dL (ref 8–23)
CO2: 23 mmol/L (ref 22–32)
CREATININE: 1.26 mg/dL — AB (ref 0.44–1.00)
Calcium: 9.3 mg/dL (ref 8.9–10.3)
Chloride: 105 mmol/L (ref 98–111)
GFR calc Af Amer: 45 mL/min — ABNORMAL LOW (ref >60)
GFR, EST NON AFRICAN AMERICAN: 39 mL/min — AB (ref >60)
Glucose, Bld: 95 mg/dL (ref 70–99)
Potassium: 4.6 mmol/L (ref 3.5–5.1)
SODIUM: 136 mmol/L (ref 135–145)

## 2018-02-22 LAB — CBC
HCT: 36.3 % (ref 36.0–46.0)
Hemoglobin: 11 g/dL — ABNORMAL LOW (ref 12.0–15.0)
MCH: 29.3 pg (ref 26.0–34.0)
MCHC: 30.3 g/dL (ref 30.0–36.0)
MCV: 96.5 fL (ref 80.0–100.0)
Platelets: 227 10*3/uL (ref 150–400)
RBC: 3.76 MIL/uL — ABNORMAL LOW (ref 3.87–5.11)
RDW: 13.8 % (ref 11.5–15.5)
WBC: 7.2 10*3/uL (ref 4.0–10.5)
nRBC: 0 % (ref 0.0–0.2)

## 2018-02-22 MED ORDER — SODIUM CHLORIDE 0.9 % IV SOLN
1.0000 g | Freq: Once | INTRAVENOUS | Status: AC
  Administered 2018-02-23: 1 g via INTRAVENOUS
  Filled 2018-02-22: qty 10

## 2018-02-22 MED ORDER — ACETAMINOPHEN 650 MG RE SUPP: 650.0000 mg | RECTAL | Status: DC | PRN

## 2018-02-22 MED ORDER — SODIUM CHLORIDE 0.9 % IV SOLN
1.0000 g | INTRAVENOUS | Status: DC
  Administered 2018-02-23 – 2018-02-24 (×2): 1 g via INTRAVENOUS
  Filled 2018-02-22: qty 10
  Filled 2018-02-22: qty 1
  Filled 2018-02-22: qty 10
  Filled 2018-02-22: qty 1
  Filled 2018-02-22: qty 10

## 2018-02-22 MED ORDER — ACETAMINOPHEN 325 MG PO TABS
650.0000 mg | ORAL_TABLET | ORAL | Status: DC | PRN
  Administered 2018-02-23 – 2018-02-24 (×3): 650 mg via ORAL
  Filled 2018-02-22 (×3): qty 2

## 2018-02-22 MED ORDER — STROKE: EARLY STAGES OF RECOVERY BOOK
Freq: Once | Status: AC
  Administered 2018-02-23: 21:00:00
  Filled 2018-02-22 (×2): qty 1

## 2018-02-22 MED ORDER — ACETAMINOPHEN 160 MG/5ML PO SOLN: 650.0000 mg | ORAL | Status: DC | PRN

## 2018-02-22 MED ORDER — ASPIRIN 300 MG RE SUPP: 300.0000 mg | Freq: Every day | RECTAL | Status: DC

## 2018-02-22 MED ORDER — ASPIRIN 325 MG PO TABS
325.0000 mg | ORAL_TABLET | Freq: Every day | ORAL | Status: DC
  Administered 2018-02-23 – 2018-02-26 (×4): 325 mg via ORAL
  Filled 2018-02-22 (×4): qty 1

## 2018-02-22 NOTE — ED Triage Notes (Signed)
Pt is from [place] and leaves with her daughter.  Pt has an appt with [facility] today.  Pt's daughter noticed her numbness and weakness in hand and foot at 1400.    Pt states she has had this numbness and weakness intermittently x 1 month.

## 2018-02-22 NOTE — ED Provider Notes (Signed)
[facility] EMERGENCY DEPARTMENT Provider Note   CSN: [id number] Arrival date & time: 02/22/18  1412     History   Chief Complaint Chief Complaint  Patient presents with  . Numbness    HPI Alexis Ford is a 84 y.o. female.  Level 5 caveat for slight confusion.  History obtained from daughter and patient.  Patient reports numbness and weakness in the left hand starting Saturday.  Today at approximately 1400, her left lower extremity became numb.  Family says she is slightly confused.  Review of systems positive for headache.  Past medical history includes hypertension and depression.  No fever, sweats, chills, chest pain, dyspnea, dysuria.     Past Medical History:  Diagnosis Date  . Anxiety   . Arthritis   . Depression   . Hypertension     Patient Active Problem List   Diagnosis Date Noted  . Left-sided weakness 02/22/2018    History reviewed. No pertinent surgical history.   OB History   None      Home Medications    Prior to Admission medications   Medication Sig Start Date End Date Taking? Authorizing Provider  ALPRAZolam Duanne Moron) 0.5 MG tablet Take 0.5 mg by mouth 3 (three) times daily as needed for anxiety.    [provider]  amLODipine (NORVASC) 2.5 MG tablet TK 1 T PO QHS 01/21/18   [provider]  citalopram (CELEXA) 10 MG tablet  02/16/18   [provider]  diazepam (VALIUM) 5 MG tablet Take 5 mg by mouth 2 (two) times daily.    [provider]  diclofenac sodium (VOLTAREN) 1 % GEL Apply 2 g topically 4 (four) times daily.    [provider]  fesoterodine (TOVIAZ) 4 MG TB24 tablet Take 4 mg by mouth daily.    [provider]  furosemide (LASIX) 20 MG tablet Take 1 tablet (20 mg total) by mouth daily. 10/30/15 11/06/15  Noemi Chapel, MD  hydrochlorothiazide (HYDRODIURIL) 25 MG tablet Take 25 mg by mouth daily.    [provider]  HYDROcodone-acetaminophen (VICODIN) 5-500 MG per tablet Take  1 tablet by mouth 2 times daily at 12 noon and 4 pm.    [provider]  hydrOXYzine (ATARAX/VISTARIL) 25 MG tablet  02/22/18   [provider]  Linaclotide (LINZESS) 145 MCG CAPS capsule Take 145 mcg by mouth daily.    [provider]  meloxicam (MOBIC) 7.5 MG tablet Take 7.5 mg by mouth daily.    [provider]  NIFEdipine (PROCARDIA-XL/ADALAT CC) 30 MG 24 hr tablet Take 30 mg by mouth daily.    [provider]  PARoxetine (PAXIL) 40 MG tablet Take 40 mg by mouth every morning.    [provider]  potassium chloride SA (K-DUR,KLOR-CON) 20 MEQ tablet Take 20 mEq by mouth daily.    [provider]  SitaGLIPtin-MetFORMIN HCl [phone] MG TB24 Take 1 tablet by mouth daily.    [provider]  spironolactone (ALDACTONE) 25 MG tablet  02/16/18   [provider]  SYMBICORT 80-4.5 MCG/ACT inhaler INL 2 PFS PO BID 01/07/18   [provider]    Family History No family history on file.  Social History Social History   Tobacco Use  . Smoking status: Never Smoker  . Smokeless tobacco: Never Used  Substance Use Topics  . Alcohol use: Never    Frequency: Never  . Drug use: Never     Allergies   Patient has no known  allergies.   Review of Systems Review of Systems  Unable to perform ROS: Acuity of condition     Physical Exam Updated Vital Signs BP 118/65   Pulse 72   Temp 98.1 F (36.7 C) (Oral)   Resp 17   Ht 4\' 11"  (1.499 m)   Wt 61.7 kg   SpO2 100%   BMI 27.47 kg/m   Physical Exam  Constitutional:  nad  HENT:  Head: Normocephalic and atraumatic.  Eyes: Conjunctivae are normal.  Neck: Neck supple.  Cardiovascular: Normal rate and regular rhythm.  Pulmonary/Chest: Effort normal and breath sounds normal.  Abdominal: Soft. Bowel sounds are normal.  Musculoskeletal: Normal range of motion.  Neurological: She is alert.  Weak grip of left hand.  Decreased range of motion of left leg.   Skin: Skin is warm and dry.  Psychiatric: She has a normal mood and affect. Her behavior is normal.  Nursing note and vitals reviewed.    ED Treatments / Results  Labs (all labs ordered are listed, but only abnormal results are displayed) Labs Reviewed  BASIC METABOLIC PANEL - Abnormal; Notable for the following components:      Result Value   Creatinine, Ser 1.26 (*)    GFR calc non Af Amer 39 (*)    GFR calc Af Amer 45 (*)    All other components within normal limits  CBC - Abnormal; Notable for the following components:   RBC 3.76 (*)    Hemoglobin 11.0 (*)    All other components within normal limits  URINALYSIS, ROUTINE W REFLEX MICROSCOPIC - Abnormal; Notable for the following components:   Color, Urine AMBER (*)    APPearance CLOUDY (*)    Hgb urine dipstick SMALL (*)    Leukocytes, UA MODERATE (*)    Bacteria, UA MANY (*)    All other components within normal limits  URINE CULTURE  HEMOGLOBIN A1C  LIPID PANEL  CBC  COMPREHENSIVE METABOLIC PANEL    EKG EKG Interpretation  Date/Time:  Monday February 22 2018 19:15:20 EDT Ventricular Rate:  78 PR Interval:    QRS Duration: 104 QT Interval:  411 QTC Calculation: 469 R Axis:   21 Text Interpretation:  Sinus rhythm Ventricular premature complex Low voltage, precordial leads Baseline wander in lead(s) I III aVR aVL Confirmed by Nat Christen [phone]) on 02/22/2018 8:50:43 PM   Radiology Ct Head Wo Contrast  Result Date: 02/22/2018 CLINICAL DATA:  84 year old female with LEFT sided numbness and weakness for 4 days. EXAM: CT HEAD WITHOUT CONTRAST TECHNIQUE: Contiguous axial images were obtained from the base of the skull through the vertex without intravenous contrast. COMPARISON:  12/05/2005 CT FINDINGS: Brain: No evidence of acute infarction, hemorrhage, hydrocephalus, extra-axial collection or mass lesion/mass effect. Atrophy and mild chronic small-vessel white matter ischemic changes noted. Vascular:  Atherosclerotic calcifications again noted Skull: Normal. Negative for fracture or focal lesion. Sinuses/Orbits: No acute finding. Other: None. IMPRESSION: 1. No evidence of acute intracranial abnormality 2. Atrophy and mild chronic small-vessel white matter ischemic changes. Electronically Signed   By: Margarette Canada M.D.   On: 02/22/2018 19:57    Procedures Procedures (including critical care time)  Medications Ordered in ED Medications  cefTRIAXone (ROCEPHIN) 1 g in sodium chloride 0.9 % 100 mL IVPB (has no administration in time range)   stroke: mapping our early stages of recovery book (has no administration in time range)  acetaminophen (TYLENOL) tablet 650 mg (has no administration in time range)    Or  acetaminophen (TYLENOL) solution 650 mg (has no administration in time range)    Or  acetaminophen (TYLENOL) suppository 650 mg (has no administration in time range)  aspirin suppository 300 mg (has no administration in time range)    Or  aspirin tablet 325 mg (has no administration in time range)  cefTRIAXone (ROCEPHIN) 1 g in sodium chloride 0.9 % 100 mL IVPB (has no administration in time range)     Initial Impression / Assessment and Plan / ED Course  I have reviewed the triage vital signs and the nursing notes.  Pertinent labs & imaging results that were available during my care of the patient were reviewed by me and considered in my medical decision making (see chart for details).     Patient presents with left hand weakness starting Saturday and left lower leg involvement this afternoon.  CT head negative.  Urinalysis shows evidence of infection.  Suspect minor CVA.  Will also Rx IV Rocephin for urinary tract infection.  Admit for further evaluation.   CRITICAL CARE Performed by: Nat Christen Total critical care time: 30 minutes Critical care time was exclusive of separately billable procedures and treating other patients. Critical care was necessary to treat or prevent  imminent or life-threatening deterioration. Critical care was time spent personally by me on the following activities: development of treatment plan with patient and/or surrogate as well as nursing, discussions with consultants, evaluation of patient's response to treatment, examination of patient, obtaining history from patient or surrogate, ordering and performing treatments and interventions, ordering and review of laboratory studies, ordering and review of radiographic studies, pulse oximetry and re-evaluation of patient's condition.  Final Clinical Impressions(s) / ED Diagnoses   Final diagnoses:  Left-sided weakness  AMS (altered mental status)    ED Discharge Orders    None       Nat Christen, MD 02/22/18 2308

## 2018-02-23 ENCOUNTER — Other Ambulatory Visit: Payer: Self-pay

## 2018-02-23 ENCOUNTER — Encounter (HOSPITAL_COMMUNITY): Payer: Self-pay | Admitting: Internal Medicine

## 2018-02-23 ENCOUNTER — Observation Stay (HOSPITAL_BASED_OUTPATIENT_CLINIC_OR_DEPARTMENT_OTHER): Payer: Medicare Other

## 2018-02-23 ENCOUNTER — Observation Stay (HOSPITAL_COMMUNITY): Payer: Medicare Other

## 2018-02-23 DIAGNOSIS — N390 Urinary tract infection, site not specified: Secondary | ICD-10-CM

## 2018-02-23 DIAGNOSIS — N179 Acute kidney failure, unspecified: Secondary | ICD-10-CM | POA: Diagnosis not present

## 2018-02-23 DIAGNOSIS — I6523 Occlusion and stenosis of bilateral carotid arteries: Secondary | ICD-10-CM | POA: Diagnosis not present

## 2018-02-23 DIAGNOSIS — N289 Disorder of kidney and ureter, unspecified: Secondary | ICD-10-CM

## 2018-02-23 DIAGNOSIS — I10 Essential (primary) hypertension: Secondary | ICD-10-CM | POA: Diagnosis not present

## 2018-02-23 DIAGNOSIS — R531 Weakness: Secondary | ICD-10-CM | POA: Diagnosis not present

## 2018-02-23 DIAGNOSIS — I5030 Unspecified diastolic (congestive) heart failure: Secondary | ICD-10-CM

## 2018-02-23 DIAGNOSIS — D649 Anemia, unspecified: Secondary | ICD-10-CM | POA: Diagnosis present

## 2018-02-23 DIAGNOSIS — G459 Transient cerebral ischemic attack, unspecified: Secondary | ICD-10-CM | POA: Diagnosis not present

## 2018-02-23 LAB — COMPREHENSIVE METABOLIC PANEL
ALT: 6 U/L (ref 0–44)
ANION GAP: 6 (ref 5–15)
AST: 16 U/L (ref 15–41)
Albumin: 3.1 g/dL — ABNORMAL LOW (ref 3.5–5.0)
Alkaline Phosphatase: 71 U/L (ref 38–126)
BILIRUBIN TOTAL: 0.5 mg/dL (ref 0.3–1.2)
BUN: 14 mg/dL (ref 8–23)
CO2: 25 mmol/L (ref 22–32)
Calcium: 9 mg/dL (ref 8.9–10.3)
Chloride: 108 mmol/L (ref 98–111)
Creatinine, Ser: 1 mg/dL (ref 0.44–1.00)
GFR calc Af Amer: 59 mL/min — ABNORMAL LOW (ref >60)
GFR, EST NON AFRICAN AMERICAN: 51 mL/min — AB (ref >60)
Glucose, Bld: 87 mg/dL (ref 70–99)
POTASSIUM: 3.9 mmol/L (ref 3.5–5.1)
Sodium: 139 mmol/L (ref 135–145)
TOTAL PROTEIN: 6.5 g/dL (ref 6.5–8.1)

## 2018-02-23 LAB — LIPID PANEL
Cholesterol: 114 mg/dL (ref 0–200)
HDL: 36 mg/dL — AB (ref >40)
LDL Cholesterol: 70 mg/dL (ref 0–99)
Total CHOL/HDL Ratio: 3.2 RATIO
Triglycerides: 42 mg/dL (ref <150)
VLDL: 8 mg/dL (ref 0–40)

## 2018-02-23 LAB — CBC
HEMATOCRIT: 31.9 % — AB (ref 36.0–46.0)
Hemoglobin: 9.9 g/dL — ABNORMAL LOW (ref 12.0–15.0)
MCH: 29.3 pg (ref 26.0–34.0)
MCHC: 31 g/dL (ref 30.0–36.0)
MCV: 94.4 fL (ref 80.0–100.0)
Platelets: 201 10*3/uL (ref 150–400)
RBC: 3.38 MIL/uL — ABNORMAL LOW (ref 3.87–5.11)
RDW: 13.6 % (ref 11.5–15.5)
WBC: 6.8 10*3/uL (ref 4.0–10.5)
nRBC: 0 % (ref 0.0–0.2)

## 2018-02-23 LAB — HEMOGLOBIN A1C
Hgb A1c MFr Bld: 5 % (ref 4.8–5.6)
Mean Plasma Glucose: 96.8 mg/dL

## 2018-02-23 LAB — ECHOCARDIOGRAM COMPLETE
Height: 59 in
WEIGHTICAEL: 2176 [oz_av]

## 2018-02-23 MED ORDER — HYDROXYZINE HCL 25 MG PO TABS
25.0000 mg | ORAL_TABLET | Freq: Two times a day (BID) | ORAL | Status: DC
  Administered 2018-02-23 – 2018-02-26 (×8): 25 mg via ORAL
  Filled 2018-02-23 (×8): qty 1

## 2018-02-23 MED ORDER — AMLODIPINE BESYLATE 5 MG PO TABS
2.5000 mg | ORAL_TABLET | Freq: Every day | ORAL | Status: DC
  Administered 2018-02-23 – 2018-02-25 (×4): 2.5 mg via ORAL
  Filled 2018-02-23 (×4): qty 1

## 2018-02-23 MED ORDER — POTASSIUM CHLORIDE CRYS ER 20 MEQ PO TBCR
20.0000 meq | EXTENDED_RELEASE_TABLET | Freq: Every day | ORAL | Status: DC
  Administered 2018-02-23 – 2018-02-26 (×4): 20 meq via ORAL
  Filled 2018-02-23 (×4): qty 1

## 2018-02-23 MED ORDER — PANTOPRAZOLE SODIUM 40 MG PO TBEC
40.0000 mg | DELAYED_RELEASE_TABLET | Freq: Every day | ORAL | Status: DC
  Administered 2018-02-23 – 2018-02-26 (×4): 40 mg via ORAL
  Filled 2018-02-23 (×4): qty 1

## 2018-02-23 MED ORDER — CITALOPRAM HYDROBROMIDE 20 MG PO TABS
10.0000 mg | ORAL_TABLET | Freq: Every day | ORAL | Status: DC
  Administered 2018-02-24 – 2018-02-26 (×3): 10 mg via ORAL
  Filled 2018-02-23 (×4): qty 1

## 2018-02-23 MED ORDER — MOMETASONE FURO-FORMOTEROL FUM 100-5 MCG/ACT IN AERO
2.0000 | INHALATION_SPRAY | Freq: Two times a day (BID) | RESPIRATORY_TRACT | Status: DC
  Administered 2018-02-23 – 2018-02-26 (×6): 2 via RESPIRATORY_TRACT
  Filled 2018-02-23: qty 8.8

## 2018-02-23 MED ORDER — ENOXAPARIN SODIUM 40 MG/0.4ML ~~LOC~~ SOLN
40.0000 mg | SUBCUTANEOUS | Status: DC
  Administered 2018-02-23 – 2018-02-25 (×3): 40 mg via SUBCUTANEOUS
  Filled 2018-02-23 (×3): qty 0.4

## 2018-02-23 MED ORDER — ATORVASTATIN CALCIUM 40 MG PO TABS
80.0000 mg | ORAL_TABLET | Freq: Every day | ORAL | Status: DC
  Administered 2018-02-23: 80 mg via ORAL
  Filled 2018-02-23 (×2): qty 2

## 2018-02-23 MED ORDER — SPIRONOLACTONE 25 MG PO TABS
25.0000 mg | ORAL_TABLET | Freq: Every day | ORAL | Status: DC
  Administered 2018-02-24 – 2018-02-26 (×3): 25 mg via ORAL
  Filled 2018-02-23 (×4): qty 1

## 2018-02-23 NOTE — H&P (Addendum)
[facility] H&P   Patient Demographics:    Alexis Ford, is a 84 y.o. female  MRN: [id number]   DOB - [birth year]  Admit Date - 02/22/2018  Outpatient Primary MD for the patient is Lucianne Lei, MD  Referring MD/NP/PA:   Nat Christen  Outpatient Specialists:     Patient coming from: home  Chief Complaint  Patient presents with  . Numbness      HPI:    Alexis Ford  is a 83 y.o. female, w hypertension, depression, arthritis apparently c/o left hand swelling and weakness starting on Saturday and then apparently earlier today had left leg weakness as well.  Pt denies headache, vision change, slurred speech, cp,palp, sob, n/v, diarrhea, brbpr, black stool, dysuria.    In Ed,  T 98.1  P 78 Bp 112/53 pox 99% on RA  CT brain IMPRESSION: 1. No evidence of acute intracranial abnormality 2. Atrophy and mild chronic small-vessel white matter ischemic changes.  Urinalysis wbc 21-50  Rbc 0-5  Na 136, K 4.6, Bun 17, Creatinine 1.26  Wbc 7.2, hgb 11.0, Plt 227  Pt will be admitted for left arm/ leg weakness and UTI,     Review of systems:    In addition to the HPI above,   No Fever-chills, No Headache, No changes with Vision or hearing, No problems swallowing food or Liquids, No Chest pain, Cough or Shortness of Breath, No Abdominal pain, No Nausea or Vommitting, Bowel movements are regular, No Blood in stool or Urine, No dysuria, No new skin rashes or bruises, No new joints pains-aches,   No recent weight gain or loss, No polyuria, polydypsia or polyphagia, No significant Mental Stressors.  A full 10 point Review of Systems was done, except as stated above, all other Review of Systems were negative.   With Past History of the following :    Past Medical History:  Diagnosis Date  . Anxiety   . Arthritis   . Depression   . Hypertension       History reviewed. No  pertinent surgical history.    Social History:     Social History   Tobacco Use  . Smoking status: Never Smoker  . Smokeless tobacco: Never Used  Substance Use Topics  . Alcohol use: Never    Frequency: Never     Lives - at home  Mobility - typically walks with walker   Family History :     Family History  Problem Relation Age of Onset  . Prostate cancer Father        Home Medications:   Prior to Admission medications   Medication Sig Start Date End Date Taking? Authorizing Provider  amLODipine (NORVASC) 2.5 MG tablet Take 2.5 mg by mouth at bedtime.  01/21/18  Yes [provider]  citalopram (CELEXA) 10 MG tablet Take 10 mg by mouth daily.  02/16/18  Yes [provider]  hydrOXYzine (ATARAX/VISTARIL) 25 MG tablet Take 25 mg by mouth 2 (two) times daily.  02/22/18  Yes [provider]  omeprazole (PRILOSEC) 40 MG capsule Take 40 mg by mouth daily.   Yes [provider]  potassium chloride SA (K-DUR,KLOR-CON) 20 MEQ tablet Take 20 mEq by mouth daily.   Yes [provider]  spironolactone (ALDACTONE) 25 MG tablet Take 25 mg by mouth daily.  02/16/18  Yes [provider]  SYMBICORT 80-4.5 MCG/ACT inhaler Inhale 2 puffs into the lungs 2 (two) times daily as needed (for shortness of breath).  01/07/18  Yes [provider]     Allergies:    No Known Allergies   Physical Exam:   Vitals  Blood pressure (!) 112/53, pulse 78, temperature 98.1 F (36.7 C), temperature source Oral, resp. rate 15, height '4\' 11"'  (1.499 m), weight 61.7 kg, SpO2 99 %.   1. General lying in bed in NAD,   2. Normal affect and insight, Not Suicidal or Homicidal, Awake Alert, Oriented X 3.  3. No F.N deficits, ALL C.Nerves Intact, Strength 5/5 all 4 extremities, Sensation intact all 4 extremities, Plantars down going. No pronator drift  4. Ears and Eyes appear Normal, Conjunctivae clear, pupils 1.33m symmetric,  PERRLA. Moist Oral  Mucosa.  5. Supple Neck, No JVD, No cervical lymphadenopathy appriciated, No Carotid Bruits.  6. Symmetrical Chest wall movement, Good air movement bilaterally, CTAB.  7. RRR, No Gallops, Rubs or Murmurs, No Parasternal Heave.  8. Positive Bowel Sounds, Abdomen Soft, No tenderness, No organomegaly appriciated,No rebound -guarding or rigidity.  9.  No Cyanosis, Normal Skin Turgor, No Skin Rash or Bruise.  10. Good muscle tone,  joints appear normal , no effusions, Normal ROM.  11. No Palpable Lymph Nodes in Neck or Axillae      Data Review:    CBC Recent Labs  Lab 02/22/18 1525  WBC 7.2  HGB 11.0*  HCT 36.3  PLT 227  MCV 96.5  MCH 29.3  MCHC 30.3  RDW 13.8   ------------------------------------------------------------------------------------------------------------------  Chemistries  Recent Labs  Lab 02/22/18 1525  NA 136  K 4.6  CL 105  CO2 23  GLUCOSE 95  BUN 17  CREATININE 1.26*  CALCIUM 9.3   ------------------------------------------------------------------------------------------------------------------ estimated creatinine clearance is 27.5 mL/min (A) (by C-G formula based on SCr of 1.26 mg/dL (H)). ------------------------------------------------------------------------------------------------------------------ No results for input(s): TSH, T4TOTAL, T3FREE, THYROIDAB in the last 72 hours.  Invalid input(s): FREET3  Coagulation profile No results for input(s): INR, PROTIME in the last 168 hours. ------------------------------------------------------------------------------------------------------------------- No results for input(s): DDIMER in the last 72 hours. -------------------------------------------------------------------------------------------------------------------  Cardiac Enzymes No results for input(s): CKMB, TROPONINI, MYOGLOBIN in the last 168 hours.  Invalid input(s):  CK ------------------------------------------------------------------------------------------------------------------    Component Value Date/Time   BNP 40.0 10/29/2015 2330     ---------------------------------------------------------------------------------------------------------------  Urinalysis    Component Value Date/Time   COLORURINE AMBER (A) 02/22/2018 1428   APPEARANCEUR CLOUDY (A) 02/22/2018 1428   LABSPEC 1.023 02/22/2018 1428   PHURINE 5.0 02/22/2018 1428   GLUCOSEU NEGATIVE 02/22/2018 1428   HGBUR SMALL (A) 02/22/2018 1428   BILIRUBINUR NEGATIVE 02/22/2018 1428   KETONESUR NEGATIVE 02/22/2018 1428   PROTEINUR NEGATIVE 02/22/2018 1428   NITRITE NEGATIVE 02/22/2018 1428   LEUKOCYTESUR MODERATE (A) 02/22/2018 1428    ----------------------------------------------------------------------------------------------------------------   Imaging Results:    Dg Chest 2 View  Result Date: 02/23/2018 CLINICAL DATA:  84year old female with altered mental status and left-sided weakness. EXAM:  CHEST - 2 VIEW COMPARISON:  Chest radiograph dated 04/04/2009 FINDINGS: Shallow inspiration with bibasilar atelectasis. No focal consolidation, pleural effusion or pneumothorax. Stable top-normal cardiac size. No acute osseous pathology. IMPRESSION: No active cardiopulmonary disease. Electronically Signed   By: Anner Crete M.D.   On: 02/23/2018 00:29   Ct Head Wo Contrast  Result Date: 02/22/2018 CLINICAL DATA:  84 year old female with LEFT sided numbness and weakness for 4 days. EXAM: CT HEAD WITHOUT CONTRAST TECHNIQUE: Contiguous axial images were obtained from the base of the skull through the vertex without intravenous contrast. COMPARISON:  12/05/2005 CT FINDINGS: Brain: No evidence of acute infarction, hemorrhage, hydrocephalus, extra-axial collection or mass lesion/mass effect. Atrophy and mild chronic small-vessel white matter ischemic changes noted. Vascular: Atherosclerotic  calcifications again noted Skull: Normal. Negative for fracture or focal lesion. Sinuses/Orbits: No acute finding. Other: None. IMPRESSION: 1. No evidence of acute intracranial abnormality 2. Atrophy and mild chronic small-vessel white matter ischemic changes. Electronically Signed   By: Margarette Canada M.D.   On: 02/22/2018 19:57   ekg nsr at 80, nl axis,     Assessment & Plan:    Principal Problem:   Left-sided weakness Active Problems:   Anemia   Renal insufficiency    L sided weakness MRI/ MRA brain Cardiac echo Check tsh, hga1c, lipid Please order carotid ultrasound when arrives at [facility] Start Aspirin  Start Lipitor 56m po qhs Please consult neurology in AM  UTI Urine culture pending Rocephin 1gm iv qday  Anemia Check cbc in am Consider further w/up with iron studies, b12, folate, esr, spep, upep, tsh  Renal insufficiency Hydrate with ns iv Check cmp in am  Hypertension Cont Amlodipine 2.552mpo qday Cont Spironolactone 2522mo qday Cont Kcl 54m30mo qday  Depression Cont Celexa 10mg40m day  Gerd Cont PPI  ? Asthma Cont symbicort=> dulera    DVT Prophylaxis  - SCDs   AM Labs Ordered, also please review Full Orders  Family Communication: Admission, patients condition and plan of care including tests being ordered have been discussed with the patient and daughters who indicate understanding and agree with the plan and Code Status.  Code Status  FULL CODE  Likely DC to  home  Condition GUARDED   Consults called: please call neurology in AM   Admission status: observation, pt will require observation for left sided weakness r/o TIA/ CVA  As well as iv abx for uti, depending upon results of w/up might need inpatient status  Time spent in minutes : 70    JamesJani Gravelon 02/23/2018 at 12:32 AM  Between 7am to 7pm - Pager - [phone] 7pm go to [email or web address] - password [email or web address] - Office  [phone]

## 2018-02-23 NOTE — Evaluation (Signed)
Physical Therapy Evaluation Patient Details Name: Alexis Ford MRN: [id number] DOB: 06/09/1933 Today's Date: 02/23/2018   History of Present Illness  Alexis Ford is a 84 y/o female, w HTN, depression, arthritis apparently, c/o left hand swelling and weakness starting on Saturday and then earlier today with leg weakness as well.    Clinical Impression  Patient functioning near baseline for functional mobility and gait, demonstrates slow labored movement for sitting up at bedside, once on feet able to ambulate around nursing station without loss of balance and demonstrated fair/good strength for lifting BLE back onto gurney when lying down.  Patient's daughters present and state they can take care of patient at home.  Patient will benefit from continued physical therapy in hospital and recommended venue below to increase strength, balance, endurance for safe ADLs and gait.     Follow Up Recommendations Home health PT;Supervision/Assistance - 24 hour;Supervision for mobility/OOB    Equipment Recommendations  None recommended by PT    Recommendations for Other Services       Precautions / Restrictions Precautions Precautions: Fall Restrictions Weight Bearing Restrictions: No      Mobility  Bed Mobility Overal bed mobility: Needs Assistance Bed Mobility: Supine to Sit;Sit to Supine     Supine to sit: Min assist;Mod assist Sit to supine: Min assist   General bed mobility comments: slow labored movement  Transfers Overall transfer level: Needs assistance Equipment used: Rolling walker (2 wheeled) Transfers: Sit to/from [facility] Sit to Stand: Min assist Stand pivot transfers: Min assist       General transfer comment: slow labored movement with difficulty getting back on gurney due to high  Ambulation/Gait Ambulation/Gait assistance: Min guard Gait Distance (Feet): 80 Feet Assistive device: Rolling walker (2 wheeled) Gait Pattern/deviations:  Decreased step length - right;Decreased step length - left;Decreased stride length Gait velocity: decreased   General Gait Details: slow slightly labored cadence without loss of balance  Stairs            Wheelchair Mobility    Modified Rankin (Stroke Patients Only)       Balance Overall balance assessment: Needs assistance Sitting-balance support: No upper extremity supported;Feet supported Sitting balance-Leahy Scale: Good     Standing balance support: Bilateral upper extremity supported;During functional activity Standing balance-Leahy Scale: Fair Standing balance comment: using RW                             Pertinent Vitals/Pain Pain Assessment: 0-10 Pain Score: 3  Pain Location: BLE  Pain Descriptors / Indicators: Aching;Discomfort Pain Intervention(s): Limited activity within patient's tolerance;Monitored during session    Home Living Family/patient expects to be discharged to:: Private residence Living Arrangements: Children Available Help at Discharge: Family;Available 24 hours/day Type of Home: House Home Access: Stairs to enter Entrance Stairs-Rails: None Entrance Stairs-Number of Steps: 5 Home Layout: One level Home Equipment: Walker - 2 wheels;Cane - single point;Shower seat;Wheelchair - manual      Prior Function Level of Independence: Independent with assistive device(s)         Comments: houehold ambulator with RW     Hand Dominance        Extremity/Trunk Assessment   Upper Extremity Assessment Upper Extremity Assessment: Defer to OT evaluation    Lower Extremity Assessment Lower Extremity Assessment: Generalized weakness    Cervical / Trunk Assessment Cervical / Trunk Assessment: Kyphotic  Communication   Communication: No difficulties  Cognition Arousal/Alertness: Awake/alert Behavior  During Therapy: WFL for tasks assessed/performed Overall Cognitive Status: Within Functional Limits for tasks assessed                                         General Comments      Exercises     Assessment/Plan    PT Assessment Patient needs continued PT services  PT Problem List Decreased strength;Decreased activity tolerance;Decreased balance;Decreased mobility       PT Treatment Interventions Therapeutic activities    PT Goals (Current goals can be found in the Care Plan section)  Acute Rehab PT Goals Patient Stated Goal: return home with family to assist PT Goal Formulation: With patient/family Time For Goal Achievement: 03/02/18    Frequency Min 3X/week   Barriers to discharge        Co-evaluation               AM-PAC PT "6 Clicks" Daily Activity  Outcome Measure Difficulty turning over in bed (including adjusting bedclothes, sheets and blankets)?: A Little Difficulty moving from lying on back to sitting on the side of the bed? : A Little Difficulty sitting down on and standing up from a chair with arms (e.g., wheelchair, bedside commode, etc,.)?: A Little Help needed moving to and from a bed to chair (including a wheelchair)?: A Little Help needed walking in hospital room?: A Little Help needed climbing 3-5 steps with a railing? : A Lot 6 Click Score: 17    End of Session Equipment Utilized During Treatment: Gait belt Activity Tolerance: Patient tolerated treatment well;Patient limited by fatigue Patient left: in bed;with call bell/phone within reach;with family/visitor present Nurse Communication: Mobility status PT Visit Diagnosis: Unsteadiness on feet (R26.81);Other abnormalities of gait and mobility (R26.89);Muscle weakness (generalized) (M62.81)    Time: 1761-6073 PT Time Calculation (min) (ACUTE ONLY): 29 min   Charges:   PT Evaluation $PT Eval Moderate Complexity: 1 Mod PT Treatments $Therapeutic Activity: 23-37 mins        2:21 PM, 02/23/18 Lonell Grandchild, MPT Physical Therapist with [facility] [phone] office [phone]  mobile phone

## 2018-02-23 NOTE — Plan of Care (Signed)
  Problem: Acute Rehab PT Goals(only PT should resolve) Goal: Pt Will Go Supine/Side To Sit Outcome: Progressing Flowsheets (Taken 02/23/2018 1423) Pt will go Supine/Side to Sit: with min guard assist Goal: Patient Will Transfer Sit To/From Stand Outcome: Progressing Flowsheets (Taken 02/23/2018 1423) Patient will transfer sit to/from stand: with min guard assist Goal: Pt Will Transfer Bed To Chair/Chair To Bed Outcome: Progressing Flowsheets (Taken 02/23/2018 1423) Pt will Transfer Bed to Chair/Chair to Bed: min guard assist Goal: Pt Will Ambulate Outcome: Progressing Flowsheets (Taken 02/23/2018 1423) Pt will Ambulate: 100 feet; with supervision; with rolling walker   2:24 PM, 02/23/18 Lonell Grandchild, MPT Physical Therapist with [facility] [phone] office [phone] mobile phone

## 2018-02-23 NOTE — ED Notes (Signed)
Pt transported to MRI 

## 2018-02-23 NOTE — ED Notes (Signed)
Pt has some confusion and needed to be prompted several times but was able to complete NIHSS tasks

## 2018-02-23 NOTE — Progress Notes (Signed)
*  PRELIMINARY RESULTS* Echocardiogram 2D Echocardiogram has been performed.  Alexis Ford 02/23/2018, 1:43 PM

## 2018-02-23 NOTE — ED Notes (Signed)
Physical tx walking pt with RW

## 2018-02-23 NOTE — ED Notes (Signed)
Pt returned from MRI °

## 2018-02-23 NOTE — Progress Notes (Signed)
PROGRESS NOTE    Alexis Ford  JHE:[id number] DOB: [birth year] DOA: 02/22/2018 PCP: Lucianne Lei, MD    Brief Narrative:  84 year old female who was brought to the hospital with complaints of left hand numbness and weakness.  This was also occurring in her left leg.  She was not evaluated as a code stroke since symptom onset was more than 2 days ago.  MRI did not show any acute stroke.  She is found to have possible urinary tract infection.  Suspect that her symptoms may be related to this.   Assessment & Plan:   Principal Problem:   Left-sided weakness Active Problems:   Anemia   Renal insufficiency   UTI (urinary tract infection)   1. Left-sided weakness.  MRI of the brain did not show any acute infarct.  Further stroke work-up underway.  Symptoms may be related to underlying urinary tract infection.  At the time of my exam, strength appears to be relatively equal bilaterally.  Continue on aspirin and Lipitor.  Echocardiogram shows normal ejection fraction with possible mobile mass on tricuspid valve.  Will ask cardiology to consider for transesophageal echo for better evaluation. 2. Acute kidney injury.  Possibly related to dehydration and infection.  Improved with IV fluids. 3. Hypertension.  Continue on amlodipine and spironolactone. 4. Depression.  Continue on Celexa 5. GERD.  Continue PPI   DVT prophylaxis: Lovenox Code Status: Full code Family Communication: Discussed with family at the bedside Disposition Plan: Discharge home with home health therapy   Consultants:     Procedures:  Echo: - Left ventricle: The cavity size was normal. Wall thickness was   normal. Systolic function was normal. The estimated ejection   fraction was in the range of 60% to 65%. Wall motion was normal;   there were no regional wall motion abnormalities. Doppler   parameters are consistent with abnormal left ventricular   relaxation (grade 1 diastolic dysfunction). - Aortic valve: Valve  area (VTI): 2.26 cm^2. Valve area (Vmax):   2.16 cm^2. Valve area (Vmean): 1.88 cm^2. - Atrial septum: No defect or patent foramen ovale was identified. - Tricuspid valve: There is a round well circumscribed echo density   attached to the tricuspid valve that is partially mobile   measuring 1.3 x 1.1 cm. Appears to be attached to the upstream   side of the anterior TV leaflet. 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 Consider TEE or cardiac    MRI to better evaluate.  Antimicrobials:   Ceftriaxone 10/28>   Subjective: Patient is feeling better since arrival to the emergency room.  Overall numbness and left leg has improved.  Denies any shortness of breath or chest pain.  Objective: Vitals:   02/23/18 1431 02/23/18 1513 02/23/18 1715 02/23/18 1915  BP: 107/75 (!) 113/49 (!) 110/57 (!) 104/56  Pulse: 73 70 (!) 54 74  Resp: 18  20 20   Temp: 98.2 F (36.8 C) 98 F (36.7 C) 97.6 F (36.4 C) 98.6 F (37 C)  TempSrc: Oral Oral Oral Oral  SpO2: 97% 100% 100% 100%  Weight: 63 kg     Height: 4\' 11"  (1.499 m)       Intake/Output Summary (Last 24 hours) at 02/23/2018 1958 Last data filed at 02/23/2018 0150 Gross per 24 hour  Intake 100 ml  Output -  Net 100 ml   Filed Weights   02/22/18 1417 02/23/18 1431  Weight: 61.7 kg 63 kg    Examination:  General exam:  Appears calm and comfortable  Respiratory system: Clear to auscultation. Respiratory effort normal. Cardiovascular system: S1 & S2 heard, RRR. No JVD, murmurs, rubs, gallops or clicks. No pedal edema. Gastrointestinal system: Abdomen is nondistended, soft and nontender. No organomegaly or masses felt. Normal bowel sounds heard. Central nervous system: Alert and oriented. No focal neurological deficits. Extremities: Symmetric 5 x 5 power. Skin: No rashes, lesions or ulcers Psychiatry: Judgement and insight appear normal. Mood & affect appropriate.     Data Reviewed: I have  personally reviewed following labs and imaging studies  CBC: Recent Labs  Lab 02/22/18 1525 02/23/18 0554  WBC 7.2 6.8  HGB 11.0* 9.9*  HCT 36.3 31.9*  MCV 96.5 94.4  PLT 227 831   Basic Metabolic Panel: Recent Labs  Lab 02/22/18 1525 02/23/18 0554  NA 136 139  K 4.6 3.9  CL 105 108  CO2 23 25  GLUCOSE 95 87  BUN 17 14  CREATININE 1.26* 1.00  CALCIUM 9.3 9.0   GFR: Estimated Creatinine Clearance: 35 mL/min (by C-G formula based on SCr of 1 mg/dL). Liver Function Tests: Recent Labs  Lab 02/23/18 0554  AST 16  ALT 6  ALKPHOS 71  BILITOT 0.5  PROT 6.5  ALBUMIN 3.1*   No results for input(s): LIPASE, AMYLASE in the last 168 hours. No results for input(s): AMMONIA in the last 168 hours. Coagulation Profile: No results for input(s): INR, PROTIME in the last 168 hours. Cardiac Enzymes: No results for input(s): CKTOTAL, CKMB, CKMBINDEX, TROPONINI in the last 168 hours. BNP (last 3 results) No results for input(s): PROBNP in the last 8760 hours. HbA1C: Recent Labs    02/22/18 1525  HGBA1C 5.0   CBG: No results for input(s): GLUCAP in the last 168 hours. Lipid Profile: Recent Labs    02/23/18 0554  CHOL 114  HDL 36*  LDLCALC 70  TRIG 42  CHOLHDL 3.2   Thyroid Function Tests: No results for input(s): TSH, T4TOTAL, FREET4, T3FREE, THYROIDAB in the last 72 hours. Anemia Panel: No results for input(s): VITAMINB12, FOLATE, FERRITIN, TIBC, IRON, RETICCTPCT in the last 72 hours. Sepsis Labs: No results for input(s): PROCALCITON, LATICACIDVEN in the last 168 hours.  No results found for this or any previous visit (from the past 240 hour(s)).       Radiology Studies: Dg Chest 2 View  Result Date: 02/23/2018 CLINICAL DATA:  84 year old female with altered mental status and left-sided weakness. EXAM: CHEST - 2 VIEW COMPARISON:  Chest radiograph dated 04/04/2009 FINDINGS: Shallow inspiration with bibasilar atelectasis. No focal consolidation, pleural  effusion or pneumothorax. Stable top-normal cardiac size. No acute osseous pathology. IMPRESSION: No active cardiopulmonary disease. Electronically Signed   By: Anner Crete M.D.   On: 02/23/2018 00:29   Ct Head Wo Contrast  Result Date: 02/22/2018 CLINICAL DATA:  84 year old female with LEFT sided numbness and weakness for 4 days. EXAM: CT HEAD WITHOUT CONTRAST TECHNIQUE: Contiguous axial images were obtained from the base of the skull through the vertex without intravenous contrast. COMPARISON:  12/05/2005 CT FINDINGS: Brain: No evidence of acute infarction, hemorrhage, hydrocephalus, extra-axial collection or mass lesion/mass effect. Atrophy and mild chronic small-vessel white matter ischemic changes noted. Vascular: Atherosclerotic calcifications again noted Skull: Normal. Negative for fracture or focal lesion. Sinuses/Orbits: No acute finding. Other: None. IMPRESSION: 1. No evidence of acute intracranial abnormality 2. Atrophy and mild chronic small-vessel white matter ischemic changes. Electronically Signed   By: Margarette Canada M.D.   On: 02/22/2018 19:57  Mr Brain Wo Contrast  Result Date: 02/23/2018 CLINICAL DATA:  TIA.  Bilateral hand numbness and weakness EXAM: MRI HEAD WITHOUT CONTRAST MRA HEAD WITHOUT CONTRAST TECHNIQUE: Multiplanar, multiecho pulse sequences of the brain and surrounding structures were obtained without intravenous contrast. Angiographic images of the head were obtained using MRA technique without contrast. COMPARISON:  CT head 02/22/2018 FINDINGS: MRI HEAD FINDINGS Brain: Image quality degraded by mild motion Negative for acute infarct. No significant chronic ischemia. Negative for hemorrhage mass or edema Vascular: Normal arterial flow voids Skull and upper cervical spine: Negative Sinuses/Orbits: Mucosal edema paranasal sinuses. Bilateral cataract surgery. Other: None MRA HEAD FINDINGS Both vertebral arteries patent to the basilar. Right vertebral artery dominant. PICA  patent bilaterally. Basilar patent. Superior cerebellar and posterior cerebral arteries patent bilaterally. Moderate stenosis mid right posterior cerebral artery. Internal carotid artery widely patent bilaterally. Anterior and middle cerebral arteries patent bilaterally without significant stenosis. Negative for aneurysm. IMPRESSION: Generalized atrophy.  No acute intracranial abnormality Moderate stenosis right posterior cerebral artery. Electronically Signed   By: Franchot Gallo M.D.   On: 02/23/2018 08:31   US Carotid Bilateral  Result Date: 02/23/2018 CLINICAL DATA:  Left-sided weakness EXAM: BILATERAL CAROTID DUPLEX ULTRASOUND TECHNIQUE: Pearline Cables scale imaging, color Doppler and duplex ultrasound were performed of bilateral carotid and vertebral arteries in the neck. COMPARISON:  None. FINDINGS: Criteria: Quantification of carotid stenosis is based on velocity parameters that correlate the residual internal carotid diameter with NASCET-based stenosis levels, using the diameter of the distal internal carotid lumen as the denominator for stenosis measurement. The following velocity measurements were obtained: RIGHT ICA: 90 cm/sec CCA: 58 cm/sec SYSTOLIC ICA/CCA RATIO:  1.6 ECA: 53 cm/sec LEFT ICA: 126 cm/sec CCA: 58 cm/sec SYSTOLIC ICA/CCA RATIO:  2.2 ECA: 59 cm/sec RIGHT CAROTID ARTERY: Mild calcified plaque in the bulb. Low resistance internal carotid Doppler pattern. RIGHT VERTEBRAL ARTERY:  Antegrade. LEFT CAROTID ARTERY: Moderate mixed plaque in the bulb. Low resistance internal carotid Doppler pattern. LEFT VERTEBRAL ARTERY:  Antegrade. IMPRESSION: Less than 50% stenosis in the right internal carotid artery. 50-69% stenosis in the left internal carotid artery. Electronically Signed   By: Marybelle Killings M.D.   On: 02/23/2018 10:19   Mr Jodene Nam Head/brain MH Cm  Result Date: 02/23/2018 CLINICAL DATA:  TIA.  Bilateral hand numbness and weakness EXAM: MRI HEAD WITHOUT CONTRAST MRA HEAD WITHOUT CONTRAST  TECHNIQUE: Multiplanar, multiecho pulse sequences of the brain and surrounding structures were obtained without intravenous contrast. Angiographic images of the head were obtained using MRA technique without contrast. COMPARISON:  CT head 02/22/2018 FINDINGS: MRI HEAD FINDINGS Brain: Image quality degraded by mild motion Negative for acute infarct. No significant chronic ischemia. Negative for hemorrhage mass or edema Vascular: Normal arterial flow voids Skull and upper cervical spine: Negative Sinuses/Orbits: Mucosal edema paranasal sinuses. Bilateral cataract surgery. Other: None MRA HEAD FINDINGS Both vertebral arteries patent to the basilar. Right vertebral artery dominant. PICA patent bilaterally. Basilar patent. Superior cerebellar and posterior cerebral arteries patent bilaterally. Moderate stenosis mid right posterior cerebral artery. Internal carotid artery widely patent bilaterally. Anterior and middle cerebral arteries patent bilaterally without significant stenosis. Negative for aneurysm. IMPRESSION: Generalized atrophy.  No acute intracranial abnormality Moderate stenosis right posterior cerebral artery. Electronically Signed   By: Franchot Gallo M.D.   On: 02/23/2018 08:31        Scheduled Meds: .  stroke: mapping our early stages of recovery book   Does not apply Once  . amLODipine  2.5 mg Oral QHS  .  aspirin  300 mg Rectal Daily   Or  . aspirin  325 mg Oral Daily  . atorvastatin  80 mg Oral q1800  . citalopram  10 mg Oral Daily  . hydrOXYzine  25 mg Oral BID  . mometasone-formoterol  2 puff Inhalation BID  . pantoprazole  40 mg Oral Daily  . potassium chloride SA  20 mEq Oral Daily  . spironolactone  25 mg Oral Daily   Continuous Infusions: . cefTRIAXone (ROCEPHIN)  IV       LOS: 0 days    Time spent: 67mins    Kathie Dike, MD [facility] Pager [phone]  If 7PM-7AM, please contact night-coverage [email or web address] Password [email or web address] 02/23/2018, 7:58 PM

## 2018-02-24 DIAGNOSIS — R531 Weakness: Secondary | ICD-10-CM | POA: Diagnosis not present

## 2018-02-24 DIAGNOSIS — I6522 Occlusion and stenosis of left carotid artery: Secondary | ICD-10-CM | POA: Diagnosis present

## 2018-02-24 DIAGNOSIS — N390 Urinary tract infection, site not specified: Secondary | ICD-10-CM | POA: Diagnosis not present

## 2018-02-24 DIAGNOSIS — N179 Acute kidney failure, unspecified: Secondary | ICD-10-CM | POA: Diagnosis not present

## 2018-02-24 DIAGNOSIS — I10 Essential (primary) hypertension: Secondary | ICD-10-CM | POA: Diagnosis not present

## 2018-02-24 LAB — BASIC METABOLIC PANEL
Anion gap: 11 (ref 5–15)
BUN: 9 mg/dL (ref 8–23)
CO2: 21 mmol/L — ABNORMAL LOW (ref 22–32)
CREATININE: 1.08 mg/dL — AB (ref 0.44–1.00)
Calcium: 9 mg/dL (ref 8.9–10.3)
Chloride: 105 mmol/L (ref 98–111)
GFR calc Af Amer: 54 mL/min — ABNORMAL LOW (ref >60)
GFR, EST NON AFRICAN AMERICAN: 46 mL/min — AB (ref >60)
GLUCOSE: 98 mg/dL (ref 70–99)
Potassium: 3.9 mmol/L (ref 3.5–5.1)
SODIUM: 137 mmol/L (ref 135–145)

## 2018-02-24 LAB — CBC
HCT: 35.2 % — ABNORMAL LOW (ref 36.0–46.0)
Hemoglobin: 10.8 g/dL — ABNORMAL LOW (ref 12.0–15.0)
MCH: 30.2 pg (ref 26.0–34.0)
MCHC: 30.7 g/dL (ref 30.0–36.0)
MCV: 98.3 fL (ref 80.0–100.0)
Platelets: 218 10*3/uL (ref 150–400)
RBC: 3.58 MIL/uL — ABNORMAL LOW (ref 3.87–5.11)
RDW: 13.7 % (ref 11.5–15.5)
WBC: 6.2 10*3/uL (ref 4.0–10.5)
nRBC: 0 % (ref 0.0–0.2)

## 2018-02-24 MED ORDER — ATORVASTATIN CALCIUM 10 MG PO TABS
10.0000 mg | ORAL_TABLET | Freq: Every day | ORAL | Status: DC
  Administered 2018-02-24 – 2018-02-25 (×2): 10 mg via ORAL
  Filled 2018-02-24 (×2): qty 1

## 2018-02-24 NOTE — Progress Notes (Signed)
PROGRESS NOTE    Alexis Ford  XBM:[id number] DOB: [birth year] DOA: 02/22/2018 PCP: Lucianne Lei, MD    Brief Narrative:  84 year old female who was brought to the hospital with complaints of left hand numbness and weakness.  This was also occurring in her left leg.  She was not evaluated as a code stroke since symptom onset was more than 2 days ago.  MRI did not show any acute stroke.  She is found to have possible urinary tract infection.  Suspect that her symptoms may be related to this.   Assessment & Plan:   Principal Problem:   Left-sided weakness Active Problems:   Anemia   Renal insufficiency   UTI (urinary tract infection)   1. Left-sided weakness.  MRI of the brain did not show any acute infarct.  Symptoms may be related to underlying urinary tract infection.  Currently, strength appears to be equal bilaterally.  Continue on aspirin and Lipitor. Carotid dopplers shows asymptomatic left carotid stenosis of 50-69%. This will need further follow up as an outpatient. Echocardiogram shows normal ejection fraction with possible mobile mass on tricuspid valve. Discussed with cardiology and plans are for TEE in AM. LDL of 70, on low dose statin. A1C is 5.0. 2. Acute kidney injury.  Related to dehydration and infection.  Improved with IV fluids. 3. Hypertension.  Continue on amlodipine and spironolactone. Blood pressure stable 4. Depression.  Continue on Celexa 5. GERD.  Continue PPI 6. UTI. Urine positive for GNR. Currently on rocephin, follow up further identification   DVT prophylaxis: Lovenox Code Status: Full code Family Communication: Discussed with family at the bedside Disposition Plan: Discharge home with home health therapy   Consultants:     Procedures:  Echo: - Left ventricle: The cavity size was normal. Wall thickness was   normal. Systolic function was normal. The estimated ejection   fraction was in the range of 60% to 65%. Wall motion was normal;   there  were no regional wall motion abnormalities. Doppler   parameters are consistent with abnormal left ventricular   relaxation (grade 1 diastolic dysfunction). - Aortic valve: Valve area (VTI): 2.26 cm^2. Valve area (Vmax):   2.16 cm^2. Valve area (Vmean): 1.88 cm^2. - Atrial septum: No defect or patent foramen ovale was identified. - Tricuspid valve: There is a round well circumscribed echo density   attached to the tricuspid valve that is partially mobile   measuring 1.3 x 1.1 cm. Appears to be attached to the upstream   side of the anterior TV leaflet. 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 Consider TEE or cardiac    MRI to better evaluate.  Antimicrobials:   Ceftriaxone 10/28>   Subjective: Patient is feeling better today. Overall weakness is better today. No fever  Objective: Vitals:   02/24/18 0115 02/24/18 0509 02/24/18 0731 02/24/18 0915  BP: (!) 89/48 111/65  110/69  Pulse: 77 73  75  Resp: 20 18  20   Temp: 98.1 F (36.7 C) 98.3 F (36.8 C)  98.1 F (36.7 C)  TempSrc: Oral Oral  Oral  SpO2: 100% 95% 98% 98%  Weight:      Height:        Intake/Output Summary (Last 24 hours) at 02/24/2018 1528 Last data filed at 02/24/2018 0900 Gross per 24 hour  Intake 560.43 ml  Output 700 ml  Net -139.57 ml   Filed Weights   02/22/18 1417 02/23/18 1431  Weight: 61.7 kg 63 kg  Examination:  General exam: Appears calm and comfortable  Respiratory system: Clear to auscultation. Respiratory effort normal. Cardiovascular system: S1 & S2 heard, RRR. No JVD, murmurs, rubs, gallops or clicks. No pedal edema. Gastrointestinal system: Abdomen is nondistended, soft and nontender. No organomegaly or masses felt. Normal bowel sounds heard. Central nervous system: Alert and oriented. No focal neurological deficits. Extremities: Symmetric 5 x 5 power. Skin: No rashes, lesions or ulcers Psychiatry: Judgement and insight appear normal. Mood &  affect appropriate.     Data Reviewed: I have personally reviewed following labs and imaging studies  CBC: Recent Labs  Lab 02/22/18 1525 02/23/18 0554 02/24/18 1154  WBC 7.2 6.8 6.2  HGB 11.0* 9.9* 10.8*  HCT 36.3 31.9* 35.2*  MCV 96.5 94.4 98.3  PLT 227 201 109   Basic Metabolic Panel: Recent Labs  Lab 02/22/18 1525 02/23/18 0554 02/24/18 1154  NA 136 139 137  K 4.6 3.9 3.9  CL 105 108 105  CO2 23 25 21*  GLUCOSE 95 87 98  BUN 17 14 9   CREATININE 1.26* 1.00 1.08*  CALCIUM 9.3 9.0 9.0   GFR: Estimated Creatinine Clearance: 32.4 mL/min (A) (by C-G formula based on SCr of 1.08 mg/dL (H)). Liver Function Tests: Recent Labs  Lab 02/23/18 0554  AST 16  ALT 6  ALKPHOS 71  BILITOT 0.5  PROT 6.5  ALBUMIN 3.1*   No results for input(s): LIPASE, AMYLASE in the last 168 hours. No results for input(s): AMMONIA in the last 168 hours. Coagulation Profile: No results for input(s): INR, PROTIME in the last 168 hours. Cardiac Enzymes: No results for input(s): CKTOTAL, CKMB, CKMBINDEX, TROPONINI in the last 168 hours. BNP (last 3 results) No results for input(s): PROBNP in the last 8760 hours. HbA1C: Recent Labs    02/22/18 1525  HGBA1C 5.0   CBG: No results for input(s): GLUCAP in the last 168 hours. Lipid Profile: Recent Labs    02/23/18 0554  CHOL 114  HDL 36*  LDLCALC 70  TRIG 42  CHOLHDL 3.2   Thyroid Function Tests: No results for input(s): TSH, T4TOTAL, FREET4, T3FREE, THYROIDAB in the last 72 hours. Anemia Panel: No results for input(s): VITAMINB12, FOLATE, FERRITIN, TIBC, IRON, RETICCTPCT in the last 72 hours. Sepsis Labs: No results for input(s): PROCALCITON, LATICACIDVEN in the last 168 hours.  Recent Results (from the past 240 hour(s))  Urine culture     Status: Abnormal (Preliminary result)   Collection Time: 02/22/18  2:28 PM  Result Value Ref Range Status   Specimen Description   Final    URINE, RANDOM Performed at [facility], [address], [place], [place] [address]    Special Requests   Final    NONE Performed at [facility], [address], [place], [place] [address]    Culture >=100,000 COLONIES/mL ESCHERICHIA COLI (A)  Final   Report Status PENDING  Incomplete         Radiology Studies: Dg Chest 2 View  Result Date: 02/23/2018 CLINICAL DATA:  84 year old female with altered mental status and left-sided weakness. EXAM: CHEST - 2 VIEW COMPARISON:  Chest radiograph dated 04/04/2009 FINDINGS: Shallow inspiration with bibasilar atelectasis. No focal consolidation, pleural effusion or pneumothorax. Stable top-normal cardiac size. No acute osseous pathology. IMPRESSION: No active cardiopulmonary disease. Electronically Signed   By: Anner Crete M.D.   On: 02/23/2018 00:29   Ct Head Wo Contrast  Result Date: 02/22/2018 CLINICAL DATA:  84 year old female with LEFT sided numbness and weakness for  4 days. EXAM: CT HEAD WITHOUT CONTRAST TECHNIQUE: Contiguous axial images were obtained from the base of the skull through the vertex without intravenous contrast. COMPARISON:  12/05/2005 CT FINDINGS: Brain: No evidence of acute infarction, hemorrhage, hydrocephalus, extra-axial collection or mass lesion/mass effect. Atrophy and mild chronic small-vessel white matter ischemic changes noted. Vascular: Atherosclerotic calcifications again noted Skull: Normal. Negative for fracture or focal lesion. Sinuses/Orbits: No acute finding. Other: None. IMPRESSION: 1. No evidence of acute intracranial abnormality 2. Atrophy and mild chronic small-vessel white matter ischemic changes. Electronically Signed   By: Margarette Canada M.D.   On: 02/22/2018 19:57   Mr Brain Wo Contrast  Result Date: 02/23/2018 CLINICAL DATA:  TIA.  Bilateral hand numbness and weakness EXAM: MRI HEAD WITHOUT CONTRAST MRA HEAD WITHOUT CONTRAST TECHNIQUE: Multiplanar, multiecho pulse sequences of the brain and surrounding structures were obtained without  intravenous contrast. Angiographic images of the head were obtained using MRA technique without contrast. COMPARISON:  CT head 02/22/2018 FINDINGS: MRI HEAD FINDINGS Brain: Image quality degraded by mild motion Negative for acute infarct. No significant chronic ischemia. Negative for hemorrhage mass or edema Vascular: Normal arterial flow voids Skull and upper cervical spine: Negative Sinuses/Orbits: Mucosal edema paranasal sinuses. Bilateral cataract surgery. Other: None MRA HEAD FINDINGS Both vertebral arteries patent to the basilar. Right vertebral artery dominant. PICA patent bilaterally. Basilar patent. Superior cerebellar and posterior cerebral arteries patent bilaterally. Moderate stenosis mid right posterior cerebral artery. Internal carotid artery widely patent bilaterally. Anterior and middle cerebral arteries patent bilaterally without significant stenosis. Negative for aneurysm. IMPRESSION: Generalized atrophy.  No acute intracranial abnormality Moderate stenosis right posterior cerebral artery. Electronically Signed   By: Franchot Gallo M.D.   On: 02/23/2018 08:31   US Carotid Bilateral  Result Date: 02/23/2018 CLINICAL DATA:  Left-sided weakness EXAM: BILATERAL CAROTID DUPLEX ULTRASOUND TECHNIQUE: Pearline Cables scale imaging, color Doppler and duplex ultrasound were performed of bilateral carotid and vertebral arteries in the neck. COMPARISON:  None. FINDINGS: Criteria: Quantification of carotid stenosis is based on velocity parameters that correlate the residual internal carotid diameter with NASCET-based stenosis levels, using the diameter of the distal internal carotid lumen as the denominator for stenosis measurement. The following velocity measurements were obtained: RIGHT ICA: 90 cm/sec CCA: 58 cm/sec SYSTOLIC ICA/CCA RATIO:  1.6 ECA: 53 cm/sec LEFT ICA: 126 cm/sec CCA: 58 cm/sec SYSTOLIC ICA/CCA RATIO:  2.2 ECA: 59 cm/sec RIGHT CAROTID ARTERY: Mild calcified plaque in the bulb. Low resistance  internal carotid Doppler pattern. RIGHT VERTEBRAL ARTERY:  Antegrade. LEFT CAROTID ARTERY: Moderate mixed plaque in the bulb. Low resistance internal carotid Doppler pattern. LEFT VERTEBRAL ARTERY:  Antegrade. IMPRESSION: Less than 50% stenosis in the right internal carotid artery. 50-69% stenosis in the left internal carotid artery. Electronically Signed   By: Marybelle Killings M.D.   On: 02/23/2018 10:19   Mr Jodene Nam Head/brain GY Cm  Result Date: 02/23/2018 CLINICAL DATA:  TIA.  Bilateral hand numbness and weakness EXAM: MRI HEAD WITHOUT CONTRAST MRA HEAD WITHOUT CONTRAST TECHNIQUE: Multiplanar, multiecho pulse sequences of the brain and surrounding structures were obtained without intravenous contrast. Angiographic images of the head were obtained using MRA technique without contrast. COMPARISON:  CT head 02/22/2018 FINDINGS: MRI HEAD FINDINGS Brain: Image quality degraded by mild motion Negative for acute infarct. No significant chronic ischemia. Negative for hemorrhage mass or edema Vascular: Normal arterial flow voids Skull and upper cervical spine: Negative Sinuses/Orbits: Mucosal edema paranasal sinuses. Bilateral cataract surgery. Other: None MRA HEAD FINDINGS Both vertebral arteries patent  to the basilar. Right vertebral artery dominant. PICA patent bilaterally. Basilar patent. Superior cerebellar and posterior cerebral arteries patent bilaterally. Moderate stenosis mid right posterior cerebral artery. Internal carotid artery widely patent bilaterally. Anterior and middle cerebral arteries patent bilaterally without significant stenosis. Negative for aneurysm. IMPRESSION: Generalized atrophy.  No acute intracranial abnormality Moderate stenosis right posterior cerebral artery. Electronically Signed   By: Franchot Gallo M.D.   On: 02/23/2018 08:31        Scheduled Meds: . amLODipine  2.5 mg Oral QHS  . aspirin  300 mg Rectal Daily   Or  . aspirin  325 mg Oral Daily  . atorvastatin  80 mg Oral q1800   . citalopram  10 mg Oral Daily  . enoxaparin (LOVENOX) injection  40 mg Subcutaneous Q24H  . hydrOXYzine  25 mg Oral BID  . mometasone-formoterol  2 puff Inhalation BID  . pantoprazole  40 mg Oral Daily  . potassium chloride SA  20 mEq Oral Daily  . spironolactone  25 mg Oral Daily   Continuous Infusions: . cefTRIAXone (ROCEPHIN)  IV Stopped (02/23/18 2216)     LOS: 0 days    Time spent: 23mins    Kathie Dike, MD [facility] Pager [phone]  If 7PM-7AM, please contact night-coverage [email or web address] Password [email or web address] 02/24/2018, 3:28 PM

## 2018-02-24 NOTE — Progress Notes (Signed)
Physical Therapy Treatment Patient Details Name: Alexis Ford MRN: [id number] DOB: 09-10-1933 Today's Date: 02/24/2018    History of Present Illness Alexis Ford is a 84 y/o female, w HTN, depression, arthritis apparently, c/o left hand swelling and weakness starting on Saturday and then earlier today with leg weakness as well.    PT Comments    Patient present seated in chair (assisted by OT staff) and agreeable for therapy.  Patient able to complete BLE strengthening exercises with demonstrating and verbal cueing, c/o mild stiffness in left knee, demonstrated slow slightly labored cadence during gait training in hallway without loss of balance and continue sitting up in chair after therapy with family members present in room.  Patient will benefit from continued physical therapy in hospital and recommended venue below to increase strength, balance, endurance for safe ADLs and gait.    Follow Up Recommendations  Home health PT;Supervision/Assistance - 24 hour;Supervision for mobility/OOB     Equipment Recommendations  None recommended by PT    Recommendations for Other Services       Precautions / Restrictions Precautions Precautions: Fall Restrictions Weight Bearing Restrictions: No    Mobility  Bed Mobility               General bed mobility comments: Patient presents up in chair (assisted by OT staff)  Transfers Overall transfer level: Needs assistance Equipment used: Rolling walker (2 wheeled) Transfers: Sit to/from [facility] Sit to Stand: Min assist Stand pivot transfers: Min guard       General transfer comment: slow labored movement with less reliance on RW during transfers  Ambulation/Gait Ambulation/Gait assistance: Min guard Gait Distance (Feet): 75 Feet Assistive device: Rolling walker (2 wheeled) Gait Pattern/deviations: Decreased step length - right;Decreased step length - left;Decreased stride length Gait velocity: decreased    General Gait Details: slow slightly labored cadence without loss of balance, but audible bone on bone contact in left hip   Stairs             Wheelchair Mobility    Modified Rankin (Stroke Patients Only)       Balance Overall balance assessment: Needs assistance Sitting-balance support: Feet supported;No upper extremity supported Sitting balance-Leahy Scale: Good     Standing balance support: During functional activity;Bilateral upper extremity supported Standing balance-Leahy Scale: Fair Standing balance comment: using RW                            Cognition Arousal/Alertness: Awake/alert Behavior During Therapy: WFL for tasks assessed/performed Overall Cognitive Status: Within Functional Limits for tasks assessed                                        Exercises General Exercises - Lower Extremity Long Arc Quad: Seated;Strengthening;AROM;Both;10 reps Hip Flexion/Marching: Seated;AROM;Strengthening;Both;10 reps Toe Raises: Seated;Strengthening;AROM;Both;10 reps Heel Raises: Seated;Strengthening;AROM;Both;10 reps    General Comments        Pertinent Vitals/Pain Pain Assessment: Faces Faces Pain Scale: Hurts a little bit Pain Location: BLE, LUE Pain Descriptors / Indicators: Aching;Discomfort Pain Intervention(s): Limited activity within patient's tolerance;Monitored during session    Home Living                      Prior Function            PT Goals (current goals can now be found  in the care plan section) Acute Rehab PT Goals Patient Stated Goal: return home with family to assist PT Goal Formulation: With patient/family Time For Goal Achievement: 03/02/18 Progress towards PT goals: Progressing toward goals    Frequency    Min 3X/week      PT Plan Current plan remains appropriate    Co-evaluation              AM-PAC PT "6 Clicks" Daily Activity  Outcome Measure  Difficulty turning over in  bed (including adjusting bedclothes, sheets and blankets)?: A Little Difficulty moving from lying on back to sitting on the side of the bed? : A Little Difficulty sitting down on and standing up from a chair with arms (e.g., wheelchair, bedside commode, etc,.)?: A Little Help needed moving to and from a bed to chair (including a wheelchair)?: A Little Help needed walking in hospital room?: A Little Help needed climbing 3-5 steps with a railing? : A Lot 6 Click Score: 17    End of Session Equipment Utilized During Treatment: Gait belt Activity Tolerance: Patient tolerated treatment well;Patient limited by fatigue Patient left: with call bell/phone within reach;with family/visitor present;in chair Nurse Communication: Mobility status PT Visit Diagnosis: Unsteadiness on feet (R26.81);Other abnormalities of gait and mobility (R26.89);Muscle weakness (generalized) (M62.81)     Time: 3704-8889 PT Time Calculation (min) (ACUTE ONLY): 24 min  Charges:  $Gait Training: 8-22 mins $Therapeutic Exercise: 8-22 mins                     12:13 PM, 02/24/18 Lonell Grandchild, MPT Physical Therapist with [facility] 336 [phone] office [phone] mobile phone

## 2018-02-24 NOTE — Care Management Obs Status (Signed)
[place] NOTIFICATION   Patient Details  Name: Alexis Ford MRN: [id number] Date of Birth: July 12, 1933   Medicare Observation Status Notification Given:  Yes    Sherald Barge, RN 02/24/2018, 9:34 AM

## 2018-02-24 NOTE — Progress Notes (Signed)
SLP Cancellation Note  Patient Details Name: Alexis Ford MRN: [id number] DOB: March 21, 1934   Cancelled treatment:       Reason Eval/Treat Not Completed: SLP screened, no needs identified, will sign off. MRI negative for acute changes and Pt and family report Pt being at cognitive speech language baseline. Thank you for this referral,  Amelia H. Roddie Mc, CCC-SLP Speech Language Pathologist    Wende Bushy 02/24/2018, 3:12 PM

## 2018-02-24 NOTE — Evaluation (Signed)
Occupational Therapy Evaluation Patient Details Name: Alexis Ford MRN: [id number] DOB: 01-24-1934 Today's Date: 02/24/2018    History of Present Illness Alexis Ford is a 84 y/o female, w HTN, depression, arthritis apparently, c/o left hand swelling and weakness starting on Saturday and then earlier today with leg weakness as well.   Clinical Impression   Pt presents supine in bed with both daughters present. Prior to admission, pt required some assistance with ADLs and was able to ambulate at home using RW. Pt with generalized UE weakness. No further acute care OT needs at this time. Recommend HHOT for home safety evaluation in order to help pt reach highest level of functional independence and decrease caregiver burden. Thank you for this recommendation.      Follow Up Recommendations  Home health OT;Supervision/Assistance - 24 hour    Equipment Recommendations  None recommended by OT       Precautions / Restrictions Precautions Precautions: Fall Restrictions Weight Bearing Restrictions: No      Mobility Bed Mobility Overal bed mobility: Needs Assistance Bed Mobility: Supine to Sit     Supine to sit: Mod assist     General bed mobility comments: slow labored movement  Transfers Overall transfer level: Needs assistance Equipment used: Rolling walker (2 wheeled) Transfers: Sit to/from [facility] Sit to Stand: Min assist Stand pivot transfers: Min assist                ADL either performed or assessed with clinical judgement   ADL Overall ADL's : Needs assistance/impaired     Grooming: Wash/dry hands;Wash/dry face;Set up;Sitting               Lower Body Dressing: Total assistance;Sitting/lateral leans   Toilet Transfer: Minimal assistance;Cueing for safety;Grab bars;RW   Toileting- Clothing Manipulation and Hygiene: Minimal assistance;Cueing for safety;Sit to/from stand Toileting - Clothing Manipulation Details (indicate cue type  and reason): Assisted pt pulling back gown during toilet use     Functional mobility during ADLs: Minimal assistance;Cueing for safety;Rolling walker       Vision Baseline Vision/History: No visual deficits Patient Visual Report: No change from baseline              Pertinent Vitals/Pain Pain Assessment: No/denies pain Pain Score: 0-No pain     Hand Dominance Right   Extremity/Trunk Assessment Upper Extremity Assessment Upper Extremity Assessment: Generalized weakness   Lower Extremity Assessment Lower Extremity Assessment: Defer to PT evaluation   Cervical / Trunk Assessment Cervical / Trunk Assessment: Kyphotic   Communication Communication Communication: No difficulties   Cognition Arousal/Alertness: Awake/alert Behavior During Therapy: WFL for tasks assessed/performed Overall Cognitive Status: Within Functional Limits for tasks assessed                                                Home Living Family/patient expects to be discharged to:: Private residence Living Arrangements: Children Available Help at Discharge: Family;Available 24 hours/day Type of Home: House Home Access: Stairs to enter [employer] of Steps: 5 Entrance Stairs-Rails: None Home Layout: One level               Home Equipment: Walker - 2 wheels;Cane - single point;Shower seat;Wheelchair - manual          Prior Functioning/Environment Level of Independence: Independent with assistive device(s)  Comments: houehold ambulator with RW        OT Problem List: Decreased strength;Decreased range of motion;Decreased activity tolerance;Decreased cognition;Decreased safety awareness;Decreased knowledge of use of DME or AE;Impaired UE functional use;Pain       AM-PAC PT "6 Clicks" Daily Activity     Outcome Measure Help from another person eating meals?: A Little Help from another person taking care of personal grooming?: A Little Help from  another person toileting, which includes using toliet, bedpan, or urinal?: A Little Help from another person bathing (including washing, rinsing, drying)?: A Little Help from another person to put on and taking off regular upper body clothing?: A Little Help from another person to put on and taking off regular lower body clothing?: A Little 6 Click Score: 18   End of Session Equipment Utilized During Treatment: Gait belt;Rolling walker  Activity Tolerance: Patient tolerated treatment well Patient left: in chair;with call bell/phone within reach;with family/visitor present  OT Visit Diagnosis: Muscle weakness (generalized) (M62.81)                Time: 6063-0160 OT Time Calculation (min): 45 min Charges:  OT General Charges $OT Visit: 1 Visit OT Evaluation $OT Eval Low Complexity: [place], OTR/L  [phone] 02/24/2018, 8:20 AM

## 2018-02-24 NOTE — Care Management Note (Signed)
Case Management Note  Patient Details  Name: Alexis Ford MRN: [id number] Date of Birth: 24-Sep-1933        Admitted with left sided weakkness. Pt from home, plans to go stay with daughter Judeen Hammans) in [place] at [place]. Other two daughters at bedside. Pt requested I speak with Judeen Hammans about [facility] planning. Per daughter she has no preference of [place] provider, CM reviewed CMS website listing provider options for home address. Referral given to [facility] rep, [place]. Family aware HH has 48 hrs to make first visit. Aware DC planned for next 24-48 hrs.             Contact info for daughter is:  Home # [phone] Cell # [phone] [address], [place] [address]  Expected Discharge Date:  02/25/18               Expected Discharge Plan:  [place]  In-House Referral:     Discharge planning Services  CM Consult  Post Acute Care Choice:  Home Health Choice offered to:  Adult Children  HH Arranged:  PT HH Agency:  [place]  Status of Service:  Completed, signed off  If discussed at [place] of Stay Meetings, dates discussed:    Additional Comments:  Sherald Barge, RN 02/24/2018, 3:17 PM

## 2018-02-25 ENCOUNTER — Other Ambulatory Visit (HOSPITAL_COMMUNITY): Payer: Medicare Other

## 2018-02-25 ENCOUNTER — Encounter (HOSPITAL_COMMUNITY)
Admission: EM | Disposition: A | Discharge status: Home / Self Care | Payer: Self-pay | Source: Home / Self Care | Attending: Emergency Medicine

## 2018-02-25 DIAGNOSIS — N390 Urinary tract infection, site not specified: Secondary | ICD-10-CM | POA: Diagnosis not present

## 2018-02-25 DIAGNOSIS — I6522 Occlusion and stenosis of left carotid artery: Secondary | ICD-10-CM

## 2018-02-25 DIAGNOSIS — D649 Anemia, unspecified: Secondary | ICD-10-CM | POA: Diagnosis not present

## 2018-02-25 DIAGNOSIS — R531 Weakness: Secondary | ICD-10-CM | POA: Diagnosis not present

## 2018-02-25 DIAGNOSIS — N179 Acute kidney failure, unspecified: Secondary | ICD-10-CM | POA: Diagnosis not present

## 2018-02-25 DIAGNOSIS — I10 Essential (primary) hypertension: Secondary | ICD-10-CM | POA: Diagnosis not present

## 2018-02-25 LAB — URINE CULTURE

## 2018-02-25 SURGERY — ECHOCARDIOGRAM, TRANSESOPHAGEAL
Anesthesia: Moderate Sedation

## 2018-02-25 MED ORDER — ATORVASTATIN CALCIUM 10 MG PO TABS: 10.0000 mg | ORAL_TABLET | Freq: Every day | ORAL | 0 refills | Status: DC

## 2018-02-25 MED ORDER — SODIUM CHLORIDE 0.9 % IV SOLN
1.0000 g | Freq: Once | INTRAVENOUS | Status: AC
  Administered 2018-02-25: 1 g via INTRAVENOUS
  Filled 2018-02-25: qty 1

## 2018-02-25 MED ORDER — SODIUM CHLORIDE 0.9 % IV SOLN
INTRAVENOUS | Status: DC | PRN
  Administered 2018-02-25: 250 mL via INTRAVENOUS

## 2018-02-25 MED ORDER — ASPIRIN EC 81 MG PO TBEC: 81.0000 mg | DELAYED_RELEASE_TABLET | Freq: Every day | ORAL | 2 refills | Status: AC

## 2018-02-25 MED ORDER — NITROFURANTOIN MONOHYD MACRO 100 MG PO CAPS: 100.0000 mg | ORAL_CAPSULE | Freq: Two times a day (BID) | ORAL | 0 refills | Status: AC

## 2018-02-25 NOTE — Progress Notes (Signed)
Physical Therapy Treatment Patient Details Name: Alexis Ford MRN: [id number] DOB: 1933-12-02 Today's Date: 02/25/2018    History of Present Illness Syriah Ford is a 84 y/o female, w HTN, depression, arthritis apparently, c/o left hand swelling and weakness starting on Saturday and then earlier today with leg weakness as well.    PT Comments    Pt was attended by her daughter during the session, and discovered pt had not had the TEE as the doctor was trying to find out her history of the test in another hospital.  Pt is able to walk more fluidly today than previous note indicates, with better tolerance for joint changes of hip.  Her efforts were good, and was quite able to be assisted to the BR with PT.  Recommend nursing consider using RW and do away with purwick unless other factors are considered.   Follow Up Recommendations  Home health PT;Supervision/Assistance - 24 hour;Supervision for mobility/OOB     Equipment Recommendations  None recommended by PT    Recommendations for Other Services       Precautions / Restrictions Precautions Precautions: Fall Restrictions Weight Bearing Restrictions: No    Mobility  Bed Mobility Overal bed mobility: Needs Assistance Bed Mobility: Supine to Sit;Sit to Supine     Supine to sit: Min assist Sit to supine: Min assist   General bed mobility comments: minimal help as pt is attempting to work for herself  Transfers Overall transfer level: Needs assistance Equipment used: Rolling walker (2 wheeled) Transfers: Sit to/from [employer] to Stand: Min guard;Min assist Stand pivot transfers: Min guard       General transfer comment: minor help to turn but cued her to stay with the walker  Ambulation/Gait Ambulation/Gait assistance: Min guard Gait Distance (Feet): 70 Feet Assistive device: Rolling walker (2 wheeled) Gait Pattern/deviations: Decreased stride length;Step-through pattern;Narrow base of  support;Drifts right/left Gait velocity: decreased Gait velocity interpretation: <1.8 ft/sec, indicate of risk for recurrent falls General Gait Details: more fluid but note crepitus of her joints with gait   Stairs             Wheelchair Mobility    Modified Rankin (Stroke Patients Only)       Balance Overall balance assessment: Needs assistance Sitting-balance support: Feet supported Sitting balance-Leahy Scale: Good     Standing balance support: Bilateral upper extremity supported;During functional activity Standing balance-Leahy Scale: Fair Standing balance comment: using walker for support but not excessively                            Cognition Arousal/Alertness: Awake/alert Behavior During Therapy: WFL for tasks assessed/performed Overall Cognitive Status: Within Functional Limits for tasks assessed                                        Exercises      General Comments        Pertinent Vitals/Pain Pain Assessment: No/denies pain    Home Living                      Prior Function            PT Goals (current goals can now be found in the care plan section) Progress towards PT goals: Progressing toward goals    Frequency    Min 3X/week  PT Plan Current plan remains appropriate    Co-evaluation              AM-PAC PT "6 Clicks" Daily Activity  Outcome Measure  Difficulty turning over in bed (including adjusting bedclothes, sheets and blankets)?: A Little Difficulty moving from lying on back to sitting on the side of the bed? : Unable Difficulty sitting down on and standing up from a chair with arms (e.g., wheelchair, bedside commode, etc,.)?: Unable Help needed moving to and from a bed to chair (including a wheelchair)?: A Little Help needed walking in hospital room?: A Little Help needed climbing 3-5 steps with a railing? : A Little 6 Click Score: 14    End of Session Equipment Utilized  During Treatment: Gait belt Activity Tolerance: Patient tolerated treatment well;Patient limited by fatigue Patient left: with call bell/phone within reach;with family/visitor present;in chair Nurse Communication: Mobility status(reminder to replace purwick if elected) PT Visit Diagnosis: Unsteadiness on feet (R26.81);Other abnormalities of gait and mobility (R26.89);Muscle weakness (generalized) (M62.81)     Time: 8184-0375 PT Time Calculation (min) (ACUTE ONLY): 31 min  Charges:  $Gait Training: 8-22 mins $Therapeutic Activity: 8-22 mins                      Ramond Dial 02/25/2018, 9:04 PM   9:07 PM, 02/25/18 Mee Hives, PT, MS Physical Therapist - [place] [phone] (Office)

## 2018-02-25 NOTE — Progress Notes (Addendum)
    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  After careful review of history and examination, the risks and benefits of transesophageal echocardiogram have been explained including risks of esophageal damage, perforation (1:10,000 risk), bleeding, pharyngeal hematoma as well as other potential complications associated with conscious sedation including aspiration, arrhythmia, respiratory failure and death. Alternatives to treatment were discussed, questions were answered.  Went to talk to the patient about the procedure and reviewed information as outlined above. She seemed confused this morning and had a tangential thought process. Able to follow commands and no focal neurological deficits noted but unable to stay on track to talk about the procedure. This is my first time meeting the patient and it is unclear if this is her normal baseline or she is more confused.  Attempted to contact her 3 daughters but was unable to reach anyone (LVM for Alexis Ford as other two numbers did not offer this option). Patient's nurse says family mentioned yesterday she had undergone workup for her valve in [place] recently but the patient has refused to sign a records release form.   Therefore, will cancel TEE for this morning as she is unable to given consent and is refusing additional procedures. Cardiology will continue to follow at a distance and please let us know if patient and her family wish to proceed with additional testing.    Erma Heritage, PA-C  02/25/2018 7:44 AM   Talked with patient's daughter Alexis Ford at the bedside. She reports the patient had a procedure with a "tube passed down her throat" while in [place] but is unsure if this was a TEE or Endoscopy. The patient has signed a release form for her records and will review once available.   Signed, Erma Heritage, PA-C 02/25/2018, 10:56  AM Pager: [phone]

## 2018-02-25 NOTE — Discharge Summary (Signed)
Physician Discharge Summary  Alexis Ford WER:[id number] DOB: April 21, 1934 DOA: 02/22/2018  PCP: Lucianne Lei, MD  Admit date: 02/22/2018 Discharge date: 02/25/2018  Admitted From: Home Disposition: Home  Recommendations for Outpatient Follow-up:  1. Follow up with PCP in 1-2 weeks 2. Please obtain BMP/CBC in one week 3. Patient will follow-up with cardiology to consider outpatient TEE once prior records are available for review 4. Patient will need follow-up of left carotid stenosis with carotid ultrasound in 6 months.  Home Health: Home health PT/OT Equipment/Devices:  Discharge Condition: Stable CODE STATUS: Full code Diet recommendation: Heart healthy  Brief/Interim Summary: 84 year old female who was brought to the hospital with complaints of left hand numbness and weakness.  This was also occurring in her left leg.  She was not evaluated as a code stroke since symptom onset was more than 2 days ago.  MRI did not show any acute stroke.  She is found to have possible urinary tract infection.   Discharge Diagnoses:  Principal Problem:   Left-sided weakness Active Problems:   Anemia   Renal insufficiency   UTI (urinary tract infection)   HTN (hypertension)   AKI (acute kidney injury) ([place])   Left carotid stenosis  1. Left-sided weakness.  Patient is a poor historian so it is difficult to tease out her exact symptoms.  It is difficult to say she was truly weak/numb unilaterally or bilaterally.  MRI brain did not show any acute infarct.  Symptoms may be related to urinary tract infection versus TIA.  She is continued on aspirin and Lipitor.  Carotid Dopplers showed asymptomatic left carotid stenosis 50 to 69%.  This can be further followed up as an outpatient with surveillance.  Echocardiogram showed normal ejection fraction, with possible mobile density on tricuspid valve.  Discussed with Dr. 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  Plans were for TEE to better  visualize this finding.  Family reports that this may be a chronic finding and may have already been evaluated in [place].  They are in the process of requesting records.  On further discussion with Dr. Harl Bowie, it appears that this can be reasonably evaluated as an outpatient.  Patient will follow-up in the cardiology clinic once records are available to discuss possible TEE versus any further work-up as needed. 2. Acute kidney injury.  Related to dehydration and infection.  Improved with IV fluids. 3. Hypertension.  On amlodipine and spironolactone.  Blood pressure stable. 4. Depression.  Continue Celexa. 5. GERD.  Continue PPI. 6. Urinary tract infection.  Urine culture positive for E. coli.  She was treated with Rocephin and transition to Macrobid based on sensitivities.  Discharge Instructions  Discharge Instructions    Diet - low sodium heart healthy   Complete by:  As directed    Increase activity slowly   Complete by:  As directed      Allergies as of 02/25/2018   No Known Allergies     Medication List    TAKE these medications   amLODipine 2.5 MG tablet Commonly known as:  NORVASC Take 2.5 mg by mouth at bedtime.   aspirin EC 81 MG tablet Take 1 tablet (81 mg total) by mouth daily.   atorvastatin 10 MG tablet Commonly known as:  LIPITOR Take 1 tablet (10 mg total) by mouth daily at 6 PM.   citalopram 10 MG tablet Commonly known as:  CELEXA Take 10 mg by mouth daily.   hydrOXYzine 25 MG tablet Commonly known  as:  ATARAX/VISTARIL Take 25 mg by mouth 2 (two) times daily.   nitrofurantoin (macrocrystal-monohydrate) 100 MG capsule Commonly known as:  MACROBID Take 1 capsule (100 mg total) by mouth 2 (two) times daily for 3 days.   omeprazole 40 MG capsule Commonly known as:  PRILOSEC Take 40 mg by mouth daily.   potassium chloride SA 20 MEQ tablet Commonly known as:  K-DUR,KLOR-CON Take 20 mEq by mouth daily.   spironolactone 25 MG tablet Commonly known as:   ALDACTONE Take 25 mg by mouth daily.   SYMBICORT 80-4.5 MCG/ACT inhaler Generic drug:  budesonide-formoterol Inhale 2 puffs into the lungs 2 (two) times daily as needed (for shortness of breath).      Follow-up Information    [facility] Follow up.   Why:  home health services provided by [facility] information: [place] [address] [phone]          No Known Allergies  Consultations:  Cardiology for transesophageal echocardiogram   Procedures/Studies: Dg Chest 2 View  Result Date: 02/23/2018 CLINICAL DATA:  84 year old female with altered mental status and left-sided weakness. EXAM: CHEST - 2 VIEW COMPARISON:  Chest radiograph dated 04/04/2009 FINDINGS: Shallow inspiration with bibasilar atelectasis. No focal consolidation, pleural effusion or pneumothorax. Stable top-normal cardiac size. No acute osseous pathology. IMPRESSION: No active cardiopulmonary disease. Electronically Signed   By: Anner Crete M.D.   On: 02/23/2018 00:29   Ct Head Wo Contrast  Result Date: 02/22/2018 CLINICAL DATA:  84 year old female with LEFT sided numbness and weakness for 4 days. EXAM: CT HEAD WITHOUT CONTRAST TECHNIQUE: Contiguous axial images were obtained from the base of the skull through the vertex without intravenous contrast. COMPARISON:  12/05/2005 CT FINDINGS: Brain: No evidence of acute infarction, hemorrhage, hydrocephalus, extra-axial collection or mass lesion/mass effect. Atrophy and mild chronic small-vessel white matter ischemic changes noted. Vascular: Atherosclerotic calcifications again noted Skull: Normal. Negative for fracture or focal lesion. Sinuses/Orbits: No acute finding. Other: None. IMPRESSION: 1. No evidence of acute intracranial abnormality 2. Atrophy and mild chronic small-vessel white matter ischemic changes. Electronically Signed   By: Margarette Canada M.D.   On: 02/22/2018 19:57   Mr Brain Wo Contrast  Result  Date: 02/23/2018 CLINICAL DATA:  TIA.  Bilateral hand numbness and weakness EXAM: MRI HEAD WITHOUT CONTRAST MRA HEAD WITHOUT CONTRAST TECHNIQUE: Multiplanar, multiecho pulse sequences of the brain and surrounding structures were obtained without intravenous contrast. Angiographic images of the head were obtained using MRA technique without contrast. COMPARISON:  CT head 02/22/2018 FINDINGS: MRI HEAD FINDINGS Brain: Image quality degraded by mild motion Negative for acute infarct. No significant chronic ischemia. Negative for hemorrhage mass or edema Vascular: Normal arterial flow voids Skull and upper cervical spine: Negative Sinuses/Orbits: Mucosal edema paranasal sinuses. Bilateral cataract surgery. Other: None MRA HEAD FINDINGS Both vertebral arteries patent to the basilar. Right vertebral artery dominant. PICA patent bilaterally. Basilar patent. Superior cerebellar and posterior cerebral arteries patent bilaterally. Moderate stenosis mid right posterior cerebral artery. Internal carotid artery widely patent bilaterally. Anterior and middle cerebral arteries patent bilaterally without significant stenosis. Negative for aneurysm. IMPRESSION: Generalized atrophy.  No acute intracranial abnormality Moderate stenosis right posterior cerebral artery. Electronically Signed   By: Franchot Gallo M.D.   On: 02/23/2018 08:31   US Carotid Bilateral  Result Date: 02/23/2018 CLINICAL DATA:  Left-sided weakness EXAM: BILATERAL CAROTID DUPLEX ULTRASOUND TECHNIQUE: Pearline Cables scale imaging, color Doppler and duplex ultrasound were performed of bilateral carotid and  vertebral arteries in the neck. COMPARISON:  None. FINDINGS: Criteria: Quantification of carotid stenosis is based on velocity parameters that correlate the residual internal carotid diameter with NASCET-based stenosis levels, using the diameter of the distal internal carotid lumen as the denominator for stenosis measurement. The following velocity measurements were  obtained: RIGHT ICA: 90 cm/sec CCA: 58 cm/sec SYSTOLIC ICA/CCA RATIO:  1.6 ECA: 53 cm/sec LEFT ICA: 126 cm/sec CCA: 58 cm/sec SYSTOLIC ICA/CCA RATIO:  2.2 ECA: 59 cm/sec RIGHT CAROTID ARTERY: Mild calcified plaque in the bulb. Low resistance internal carotid Doppler pattern. RIGHT VERTEBRAL ARTERY:  Antegrade. LEFT CAROTID ARTERY: Moderate mixed plaque in the bulb. Low resistance internal carotid Doppler pattern. LEFT VERTEBRAL ARTERY:  Antegrade. IMPRESSION: Less than 50% stenosis in the right internal carotid artery. 50-69% stenosis in the left internal carotid artery. Electronically Signed   By: Marybelle Killings M.D.   On: 02/23/2018 10:19   Mr Jodene Nam Head/brain RK Cm  Result Date: 02/23/2018 CLINICAL DATA:  TIA.  Bilateral hand numbness and weakness EXAM: MRI HEAD WITHOUT CONTRAST MRA HEAD WITHOUT CONTRAST TECHNIQUE: Multiplanar, multiecho pulse sequences of the brain and surrounding structures were obtained without intravenous contrast. Angiographic images of the head were obtained using MRA technique without contrast. COMPARISON:  CT head 02/22/2018 FINDINGS: MRI HEAD FINDINGS Brain: Image quality degraded by mild motion Negative for acute infarct. No significant chronic ischemia. Negative for hemorrhage mass or edema Vascular: Normal arterial flow voids Skull and upper cervical spine: Negative Sinuses/Orbits: Mucosal edema paranasal sinuses. Bilateral cataract surgery. Other: None MRA HEAD FINDINGS Both vertebral arteries patent to the basilar. Right vertebral artery dominant. PICA patent bilaterally. Basilar patent. Superior cerebellar and posterior cerebral arteries patent bilaterally. Moderate stenosis mid right posterior cerebral artery. Internal carotid artery widely patent bilaterally. Anterior and middle cerebral arteries patent bilaterally without significant stenosis. Negative for aneurysm. IMPRESSION: Generalized atrophy.  No acute intracranial abnormality Moderate stenosis right posterior cerebral  artery. Electronically Signed   By: Franchot Gallo M.D.   On: 02/23/2018 08:31    Echo:- Left ventricle: The cavity size was normal. Wall thickness was normal. Systolic function was normal. The estimated ejection fraction was in the range of 60% to 65%. Wall motion was normal; there were no regional wall motion abnormalities. Doppler parameters are consistent with abnormal left ventricular relaxation (grade 1 diastolic dysfunction). - Aortic valve: Valve area (VTI): 2.26 cm^2. Valve area (Vmax): 2.16 cm^2. Valve area (Vmean): 1.88 cm^2. - Atrial septum: No defect or patent foramen ovale was identified. - Tricuspid valve: There is a round well circumscribed echo density attached to the tricuspid valve that is partially mobile measuring 1.3 x 1.1 cm. Appears to be attached to the upstream side of the anterior TV leaflet. 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 Consider TEE or cardiac MRI to better evaluate.  Subjective: Patient denies any complaints.  She is anxious to discharge home.  Discharge Exam: Vitals:   02/25/18 0115 02/25/18 0515 02/25/18 0747 02/25/18 0753  BP: (!) 129/58 (!) 136/55    Pulse:    (!) 102  Resp:  20    Temp: 98.7 F (37.1 C) 99.1 F (37.3 C)    TempSrc: Oral Oral    SpO2:  100% 96%   Weight:      Height:        General: Pt is alert, awake, not in acute distress Cardiovascular: RRR, S1/S2 +, no rubs, no gallops Respiratory: CTA bilaterally, no wheezing, no rhonchi Abdominal: Soft, NT, ND, bowel  sounds + Extremities: no edema, no cyanosis    The results of significant diagnostics from this hospitalization (including imaging, microbiology, ancillary and laboratory) are listed below for reference.     Microbiology: Recent Results (from the past 240 hour(s))  Urine culture     Status: Abnormal   Collection Time: 02/22/18  2:28 PM  Result Value Ref Range Status   Specimen Description   Final     URINE, RANDOM Performed at [facility], [address] [place], [place] [address]    Special Requests   Final    NONE Performed at [facility], [address], [place], [place] [address]    Culture >=100,000 COLONIES/mL ESCHERICHIA COLI (A)  Final   Report Status 02/25/2018 FINAL  Final   Organism ID, Bacteria ESCHERICHIA COLI (A)  Final      Susceptibility   Escherichia coli - MIC*    AMPICILLIN >=32 RESISTANT Resistant     CEFAZOLIN <=4 SENSITIVE Sensitive     CEFTRIAXONE <=1 SENSITIVE Sensitive     CIPROFLOXACIN >=4 RESISTANT Resistant     GENTAMICIN <=1 SENSITIVE Sensitive     IMIPENEM <=0.25 SENSITIVE Sensitive     NITROFURANTOIN <=16 SENSITIVE Sensitive     TRIMETH/SULFA <=20 SENSITIVE Sensitive     AMPICILLIN/SULBACTAM 16 INTERMEDIATE Intermediate     PIP/TAZO <=4 SENSITIVE Sensitive     Extended ESBL NEGATIVE Sensitive     * >=100,000 COLONIES/mL ESCHERICHIA COLI     Labs: BNP (last 3 results) No results for input(s): BNP in the last 8760 hours. Basic Metabolic Panel: Recent Labs  Lab 02/22/18 1525 02/23/18 0554 02/24/18 1154  NA 136 139 137  K 4.6 3.9 3.9  CL 105 108 105  CO2 23 25 21*  GLUCOSE 95 87 98  BUN 17 14 9   CREATININE 1.26* 1.00 1.08*  CALCIUM 9.3 9.0 9.0   Liver Function Tests: Recent Labs  Lab 02/23/18 0554  AST 16  ALT 6  ALKPHOS 71  BILITOT 0.5  PROT 6.5  ALBUMIN 3.1*   No results for input(s): LIPASE, AMYLASE in the last 168 hours. No results for input(s): AMMONIA in the last 168 hours. CBC: Recent Labs  Lab 02/22/18 1525 02/23/18 0554 02/24/18 1154  WBC 7.2 6.8 6.2  HGB 11.0* 9.9* 10.8*  HCT 36.3 31.9* 35.2*  MCV 96.5 94.4 98.3  PLT 227 201 218   Cardiac Enzymes: No results for input(s): CKTOTAL, CKMB, CKMBINDEX, TROPONINI in the last 168 hours. BNP: Invalid input(s): POCBNP CBG: No results for input(s): GLUCAP in the last 168 hours. D-Dimer No results for input(s): DDIMER in the last 72 hours. Hgb A1c No results  for input(s): HGBA1C in the last 72 hours. Lipid Profile Recent Labs    02/23/18 0554  CHOL 114  HDL 36*  LDLCALC 70  TRIG 42  CHOLHDL 3.2   Thyroid function studies No results for input(s): TSH, T4TOTAL, T3FREE, THYROIDAB in the last 72 hours.  Invalid input(s): FREET3 Anemia work up No results for input(s): VITAMINB12, FOLATE, FERRITIN, TIBC, IRON, RETICCTPCT in the last 72 hours. Urinalysis    Component Value Date/Time   COLORURINE AMBER (A) 02/22/2018 1428   APPEARANCEUR CLOUDY (A) 02/22/2018 1428   LABSPEC 1.023 02/22/2018 1428   PHURINE 5.0 02/22/2018 1428   GLUCOSEU NEGATIVE 02/22/2018 1428   HGBUR SMALL (A) 02/22/2018 1428   BILIRUBINUR NEGATIVE 02/22/2018 1428   KETONESUR NEGATIVE 02/22/2018 1428   PROTEINUR NEGATIVE 02/22/2018 1428   NITRITE NEGATIVE 02/22/2018 1428  LEUKOCYTESUR MODERATE (A) 02/22/2018 1428   Sepsis Labs Invalid input(s): PROCALCITONIN,  WBC,  LACTICIDVEN Microbiology Recent Results (from the past 240 hour(s))  Urine culture     Status: Abnormal   Collection Time: 02/22/18  2:28 PM  Result Value Ref Range Status   Specimen Description   Final    URINE, RANDOM Performed at [facility], [address], [place], [place] [address]    Special Requests   Final    NONE Performed at [facility], [address] [place], [place] [address]    Culture >=100,000 COLONIES/mL ESCHERICHIA COLI (A)  Final   Report Status 02/25/2018 FINAL  Final   Organism ID, Bacteria ESCHERICHIA COLI (A)  Final      Susceptibility   Escherichia coli - MIC*    AMPICILLIN >=32 RESISTANT Resistant     CEFAZOLIN <=4 SENSITIVE Sensitive     CEFTRIAXONE <=1 SENSITIVE Sensitive     CIPROFLOXACIN >=4 RESISTANT Resistant     GENTAMICIN <=1 SENSITIVE Sensitive     IMIPENEM <=0.25 SENSITIVE Sensitive     NITROFURANTOIN <=16 SENSITIVE Sensitive     TRIMETH/SULFA <=20 SENSITIVE Sensitive     AMPICILLIN/SULBACTAM 16 INTERMEDIATE Intermediate     PIP/TAZO <=4 SENSITIVE  Sensitive     Extended ESBL NEGATIVE Sensitive     * >=100,000 COLONIES/mL ESCHERICHIA COLI     Time coordinating discharge: 77mins  SIGNED:   Kathie Dike, MD  [facility] 02/25/2018, 8:07 PM Pager   If 7PM-7AM, please contact night-coverage [email or web address] Password [email or web address]

## 2018-02-26 ENCOUNTER — Other Ambulatory Visit (HOSPITAL_COMMUNITY): Payer: Medicare Other

## 2018-02-26 NOTE — Progress Notes (Signed)
Patient seen and examined. No acute event or complaints overnight. Patient's daughter at bedside and updated on findings, anticipated treatment/plan and ready for discharge. Please refer to discharge summary written by Dr. Roderic Palau on 02-25-18 for further info/details.  Barton Dubois MD [phone]

## 2018-02-26 NOTE — Progress Notes (Signed)
Pt discharged home today per Dr. Memon. Pt's IV site D/C'd and WDL. Pt's VSS. Pt provided with home medication list, discharge instructions and prescriptions. Verbalized understanding. Pt left floor via WC in stable condition accompanied by NT.  

## 2018-04-02 ENCOUNTER — Encounter: Payer: Self-pay | Admitting: *Deleted

## 2018-04-05 ENCOUNTER — Ambulatory Visit: Payer: Medicare Other | Admitting: Physician Assistant

## 2018-04-05 ENCOUNTER — Ambulatory Visit (INDEPENDENT_AMBULATORY_CARE_PROVIDER_SITE_OTHER): Payer: Medicare Other | Admitting: Cardiology

## 2018-04-05 ENCOUNTER — Encounter: Payer: Self-pay | Admitting: Cardiology

## 2018-04-05 VITALS — BP 122/75 | HR 75 | Ht 59.0 in | Wt 134.8 lb

## 2018-04-05 DIAGNOSIS — I6523 Occlusion and stenosis of bilateral carotid arteries: Secondary | ICD-10-CM | POA: Diagnosis not present

## 2018-04-05 DIAGNOSIS — I078 Other rheumatic tricuspid valve diseases: Secondary | ICD-10-CM | POA: Diagnosis not present

## 2018-04-05 MED ORDER — ATORVASTATIN CALCIUM 10 MG PO TABS: 10.0000 mg | ORAL_TABLET | Freq: Every day | ORAL | 1 refills | Status: DC

## 2018-04-05 NOTE — Progress Notes (Addendum)
Clinical Summary Ms. Alexis Ford is a 84 y.o.female seen today as a new patient for cardiac mass.   1. 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  - from TTE suggesting of fibroelastoma - family reports being told of a "spot" on her heart during an admission in [place], they believe she had a TEE at that time as well. They report recs were for no intervention.   Lives with daughter. Assistance with baths, dressing. Does not require assistance feeding. Most activity is walking room to room at home. Some confusion on days, times.    2. Carotid stenosis - on statin and ASA.     Addendum Records from [facility] available after clinic appointment Noted indicate admission 12/2017 with chest pain and SOB, found to have TV echodensity. Iniital blood cultures were 1/2 + but later thought to be contaminant, repeat cultures negative.   12/2017 echo LVEF 65-70%, possible vegetation TV  No other records available, still unclear if she had TEE there. Will request her additional cardiac studies from there.    Past Medical History:  Diagnosis Date  . Anxiety   . Arthritis   . Depression   . Hypertension      No Known Allergies   Current Outpatient Medications  Medication Sig Dispense Refill  . amLODipine (NORVASC) 2.5 MG tablet Take 2.5 mg by mouth at bedtime.   0  . aspirin EC 81 MG tablet Take 1 tablet (81 mg total) by mouth daily. 150 tablet 2  . atorvastatin (LIPITOR) 10 MG tablet Take 1 tablet (10 mg total) by mouth daily at 6 PM. 30 tablet 0  . citalopram (CELEXA) 10 MG tablet Take 10 mg by mouth daily.     . hydrOXYzine (ATARAX/VISTARIL) 25 MG tablet Take 25 mg by mouth 2 (two) times daily.     Marland Kitchen omeprazole (PRILOSEC) 40 MG capsule Take 40 mg by mouth daily.    . potassium chloride SA (K-DUR,KLOR-CON) 20 MEQ tablet Take 20 mEq by mouth daily.    Marland Kitchen spironolactone (ALDACTONE) 25 MG tablet Take 25 mg by mouth daily.     . SYMBICORT  80-4.5 MCG/ACT inhaler Inhale 2 puffs into the lungs 2 (two) times daily as needed (for shortness of breath).   0   No current facility-administered medications for this visit.      No past surgical history on file.   No Known Allergies    Family History  Problem Relation Age of Onset  . Prostate cancer Father      Social History Ms. Heims reports that she has never smoked. She has never used smokeless tobacco. Ms. Tarry reports that she does not drink alcohol.   Review of Systems CONSTITUTIONAL: No weight loss, fever, chills, weakness or fatigue.  HEENT: Eyes: No visual loss, blurred vision, double vision or yellow sclerae.No hearing loss, sneezing, congestion, runny nose or sore throat.  SKIN: No rash or itching.  CARDIOVASCULAR: no chest pain, no palpitations.  RESPIRATORY: No shortness of breath, cough or sputum.  GASTROINTESTINAL: No anorexia, nausea, vomiting or diarrhea. No abdominal pain or blood.  GENITOURINARY: No burning on urination, no polyuria NEUROLOGICAL: No headache, dizziness, syncope, paralysis, ataxia, numbness or tingling in the extremities. No change in bowel or bladder control.  MUSCULOSKELETAL: No muscle, back pain, joint pain or stiffness.  LYMPHATICS: No enlarged nodes. No history of splenectomy.  PSYCHIATRIC: No history of depression or anxiety.  ENDOCRINOLOGIC: No reports of sweating, cold  or heat intolerance. No polyuria or polydipsia.  Marland Kitchen   Physical Examination Vitals:   04/05/18 1031  BP: 122/75  Pulse: 75  SpO2: 98%   Vitals:   04/05/18 1031  Weight: 134 lb 12.8 oz (61.1 kg)  Height: 4\' 11"  (1.499 m)    Gen: resting comfortably, no acute distress HEENT: no scleral icterus, pupils equal round and reactive, no palptable cervical adenopathy,  CV: RRR, no m/r/g, no jvd Resp: Clear to auscultation bilaterally GI: abdomen is soft, non-tender, non-distended, normal bowel sounds, no hepatosplenomegaly MSK: extremities are warm, no  edema.  Skin: warm, no rash Neuro:  no focal deficits Psych: appropriate affect    Assessment and Plan  1. 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 We are still awaiting records from [place], appears this was detected there as well and she may have had a TEE there and recs were for no intervention - given her advanced age, some dementia, and poor functional capacity would be a poor surgical candidate, I think the risk of a significant embolic event from her right sided lesion is much lower than her surgical risk - would not plan for further testing at this time, f/u records from [place]  2. Carotid stenosis - continue medical therapy   F/u pending record review     Arnoldo Lenis, M.D.

## 2018-04-05 NOTE — Patient Instructions (Signed)
Your physician recommends that you schedule a follow-up appointment in: PENDING WITH DR [facility]  Your physician recommends that you continue on your current medications as directed. Please refer to the Current Medication list given to you today.  Thank you for choosing [facility]!!

## 2018-05-04 ENCOUNTER — Telehealth (HOSPITAL_COMMUNITY): Payer: Self-pay | Admitting: Physical Therapy

## 2018-05-04 NOTE — Telephone Encounter (Signed)
Pt called to schedule -we are waiting on MD referral and when it comes we will call pt to schedule.

## 2018-05-12 DIAGNOSIS — Z6824 Body mass index (BMI) 24.0-24.9, adult: Secondary | ICD-10-CM | POA: Diagnosis not present

## 2018-05-12 DIAGNOSIS — R2681 Unsteadiness on feet: Secondary | ICD-10-CM | POA: Diagnosis not present

## 2018-05-12 DIAGNOSIS — B182 Chronic viral hepatitis C: Secondary | ICD-10-CM | POA: Diagnosis not present

## 2018-05-12 DIAGNOSIS — R069 Unspecified abnormalities of breathing: Secondary | ICD-10-CM | POA: Diagnosis not present

## 2018-05-12 DIAGNOSIS — N2589 Other disorders resulting from impaired renal tubular function: Secondary | ICD-10-CM | POA: Diagnosis not present

## 2018-05-12 DIAGNOSIS — I10 Essential (primary) hypertension: Secondary | ICD-10-CM | POA: Diagnosis not present

## 2018-06-14 ENCOUNTER — Encounter (HOSPITAL_COMMUNITY): Payer: Self-pay

## 2018-06-14 ENCOUNTER — Other Ambulatory Visit: Payer: Self-pay

## 2018-06-14 ENCOUNTER — Ambulatory Visit (HOSPITAL_COMMUNITY): Payer: Medicare Other | Attending: Family Medicine

## 2018-06-14 DIAGNOSIS — M25561 Pain in right knee: Secondary | ICD-10-CM | POA: Insufficient documentation

## 2018-06-14 DIAGNOSIS — M6281 Muscle weakness (generalized): Secondary | ICD-10-CM | POA: Insufficient documentation

## 2018-06-14 DIAGNOSIS — M25562 Pain in left knee: Secondary | ICD-10-CM | POA: Diagnosis not present

## 2018-06-14 DIAGNOSIS — M25651 Stiffness of right hip, not elsewhere classified: Secondary | ICD-10-CM | POA: Diagnosis not present

## 2018-06-14 DIAGNOSIS — G8929 Other chronic pain: Secondary | ICD-10-CM | POA: Diagnosis not present

## 2018-06-14 DIAGNOSIS — M25551 Pain in right hip: Secondary | ICD-10-CM | POA: Insufficient documentation

## 2018-06-14 NOTE — Therapy (Signed)
[place], [place], [address] Phone: [phone]   Fax:  [phone]  Physical Therapy Evaluation  Patient Details  Name: Alexis Ford MRN: [id number] Date of Birth: Dec 21, 1933 Referring Provider (PT): Alexis Lei, MD   Encounter Date: 06/14/2018  PT End of Session - 06/14/18 0945    Visit Number  1    Number of Visits  8    Date for PT Re-Evaluation  07/12/18    Authorization Type  Medicare Part A and B (no visit limit, no auth required)    Authorization Time Period  06/14/2018 - 07/16/2018    Authorization - Visit Number  1    Authorization - Number of Visits  10    PT Start Time  0949    PT Stop Time  1037    PT Time Calculation (min)  48 min    Activity Tolerance  Patient tolerated treatment well;Patient limited by pain    Behavior During Therapy  Anxious       Past Medical History:  Diagnosis Date  . Anxiety   . Arthritis   . Depression   . Hypertension     History reviewed. No pertinent surgical history.  There were no vitals filed for this visit.   Subjective Assessment - 06/14/18 1000    Subjective  Patient and daughter Alexis Ford provided subjective history.  Unclear reliability of patient as historian. She reports worsening hip pain in Rt and bil knee pain over the last 6 months. Patient and daughters Alexis Ford via face-to face, and Alexis Ford via phone) report she received injections in bil Knees and in her Rt hip ~ 5 months ago. Patient expresses fear of female physician's throughout session and anxiety towards being in medical office generally. Pt and family report she was previously living with he daughter in [place] for the last 1-2 years. She was then hospitalized in October 2019 at [facility] for Lt sided weakness. She was suspected to have dehydration, and UTI as her primary problems and was recommended to discharge home with HHPT. She did not return to [place] but rather has stayed in  [place] with another daughter, Alexis Ford. She did not receive any home health therapy services.     Pertinent History  family reports they were told patient had a "light stroke" in October 2019 when hospitalized, no evidence of this is in the chart    Limitations  Standing;Walking;House hold activities    Patient Stated Goals  to walk more like she used to    Currently in Pain?  Yes   pt would not rate, became teraful during session   Pain Score  --   patient would not rate when asked multiple times   Pain Location  Hip    Pain Orientation  Right    Pain Descriptors / Indicators  Aching    Pain Type  Chronic pain    Pain Onset  More than a month ago    Pain Frequency  Constant    Aggravating Factors   movign her hip, laying down, getting up, walking    Pain Relieving Factors  unsure    Effect of Pain on Daily Activities  moderate to severe limitation with mobility         [facility] PT Assessment - 06/14/18 0001      Assessment   Medical Diagnosis  Polyarthritis    Referring Provider (PT)  Alexis Lei, MD    Onset Date/Surgical Date  --  unsure   Next MD Visit  06/15/2018    Prior Therapy  none      Precautions   Precautions  None      Restrictions   Weight Bearing Restrictions  No      Balance Screen   Has the patient fallen in the past 6 months  No    Has the patient had a decrease in activity level because of a fear of falling?   --   pt asked multiple times, unable to focus on question   Is the patient reluctant to leave their home because of a fear of falling?   --   pt asked multiple times, unable to focus on question     [place]   staying with her daughter Alexis Ford in [place]   Available Help at Discharge  Family    Type of [place] to enter    Entrance Stairs-Number of Steps  [place]  One level    [place] - 2 wheels;Walker - 4 wheels;Cane - single point;Shower seat    Additional Comments  Patient is staying with her daughter, Alexis Ford, in [place]. She previously was staying with her daughter, Alexis Ford, in [place] before being hospitalized in Oct 2019. She requires assistance for ADL's such as bathing, dressing. She is currently using RW for mobility and previously used a SPC.       Prior Function   Level of Independence  Needs assistance with ADLs;Needs assistance with homemaking;Independent with household mobility with device      Cognition   Overall Cognitive Status  Difficult to assess    Difficult to assess due to  --   poor attention, daughter unclear regarding pt's baseline   Behaviors  Other (comment)   anxious     Observation/Other Assessments-Edema    Edema  --   mild edema noted in bil LE's     ROM / Strength   AROM / PROM / Strength  Strength;AROM      AROM   Overall AROM Comments  unable to assess Rt hip ROM due to pain (painful with PROM as well)    AROM Assessment Site  Hip    Left Hip Flexion  110    Left Hip External Rotation   25    Left Hip Internal Rotation   18      Strength   Strength Assessment Site  Hip;Knee;Ankle    Right Hip Flexion  2+/5    Left Hip Flexion  2+/5    Right Knee Extension  3+/5    Left Knee Extension  3+/5    Right Ankle Dorsiflexion  3+/5    Left Ankle Dorsiflexion  3+/5      Palpation   Palpation comment  Tenderness to palpation along Rt anterior hip joint line      Special Tests    Special Tests  Hip Special Tests    Other special tests  Unable to perform Special Testing on Rt hip due to severe limits in ROM. Patient with audible and palpable "clunking" with hip flexion (AROM and PROM) and reports of pain    Hip Special Tests   Hip Scouring;Other;Patrick (FABER) Test      Saralyn [facility]) Test   Findings  Negative    Side  Left      Hip Scouring   Findings  Negative    Side  Left      other   Findings  Negative     Side  Left    Comments  FADDIR      Ambulation/Gait   Ambulation/Gait  Yes    Ambulation/Gait Assistance  5: Supervision    Ambulation Distance (Feet)  42 Feet   90 seconds   Assistive device  Rolling walker    Gait Pattern  Step-to pattern;Decreased stride length;Decreased hip/knee flexion - right;Decreased weight shift to right;Antalgic    Ambulation Surface  Level;Indoor    Gait velocity  0.14 m/s        Objective measurements completed on examination: See above findings.     PT Education - 06/14/18 1113    Education Details  Educated on ned for imaging to Rt hip to assess joint health. Educated on plan to address strength deficits in therapy to progress mobility. Recomended following up with orthorpedic MD for bil knee pain and Rt hip limitations and pain.     Person(s) Educated  Patient    Methods  Explanation    Comprehension  Verbalized understanding       PT Short Term Goals - 06/14/18 1231      PT SHORT TERM GOAL #1   Title  Patient will be independent with HEP, updated PRN, to improve functional LE strength and reduce LE pain to improve mobility.    Time  2    Period  Weeks    Status  New        PT Long Term Goals - 06/14/18 1240      PT LONG TERM GOAL #1   Title  Patient will ambulate 0.4 m/s to achieve limited community ambulator status with use of RW during 2MWT indicating improved mobility.    Time  4    Period  Weeks    Status  New    Target Date  07/12/18      PT LONG TERM GOAL #2   Title  Patient will improve MMT for limited groups by 1/2 grade or more to indicate increased functional strength for improved quality of gait and mobility.    Time  4    Period  Weeks    Status  New      PT LONG TERM GOAL #3   Title  Patient will demonstrate safe negotiaion of 4 stairs with use of handrails to inidicate safe functional strength to enter her daughter home where she is staying.     Time  4    Period  Weeks    Status  New        Plan - 06/14/18  1246    Clinical Impression Statement  Ms. Einspahr presents to physical therapy for evaluation of Rt hip and bil knee pain. She reports pain with walking and transitional movements at her anterior Rt hip joint. She presents with significant weakness of Bil LE's and severely limited Rt hip ROM. Lt Hip ROM is close to [facility] and she denies pain with special testing. Audible and palpable "clunk/grind" is noted with Rt hip AROM/PROM. Due to pain she was unable to attain testing position for special tests. She has tenderness to palpation along Rt hip anterior joint line. Patient is ambulating with RW at 0.14 m/s indicative of household ambulator and placing her at risk of hospitalization, falls, and dependence for ADL's/IADL's. Based on her severe limitations of the  Rt hip I feel it would beneift Ms. Wands to obtain images on her Rt hip and an assessment by an orthopedist. She will benefit from skilled PT interventions for strength and mobility impairments.    Clinical Presentation  Stable    Clinical Presentation due to:  limited ROM, weakness, impaired gait, impaired balance, clinical judgement    Clinical Decision Making  Moderate    Rehab Potential  Fair    Clinical Impairments Affecting Rehab Potential  (-) pain severity    PT Frequency  2x / week    PT Duration  4 weeks    PT Treatment/Interventions  ADLs/Self Care Home Management;Aquatic Therapy;Cryotherapy;Electrical Stimulation;Moist Heat;DME Instruction;Gait training;Stair training;Functional mobility training;Therapeutic activities;Therapeutic exercise;Balance training;Neuromuscular re-education;Patient/family education;Manual techniques;Passive range of motion    PT Next Visit Plan  Review eval and goals. Follow up on referral to orthopedist by Dr. Criss Rosales and imaging of Rt hip.     Consulted and Agree with Plan of Care  Patient;Family member/caregiver    Family Member Consulted  patient's daughter Alexis Ford       Patient will benefit from skilled  therapeutic intervention in order to improve the following deficits and impairments:  Decreased balance, Decreased activity tolerance, Decreased endurance, Decreased mobility, Decreased knowledge of use of DME, Decreased range of motion, Decreased strength, Difficulty walking, Pain, Postural dysfunction, Impaired flexibility, Increased edema, Hypomobility  Visit Diagnosis: Pain in right hip  Stiffness of right hip, not elsewhere classified  Chronic pain of right knee  Chronic pain of left knee  Muscle weakness (generalized)     Problem List Patient Active Problem List   Diagnosis Date Noted  . HTN (hypertension) 02/24/2018  . AKI (acute kidney injury) ([place]) 02/24/2018  . Left carotid stenosis 02/24/2018  . Anemia 02/23/2018  . Renal insufficiency 02/23/2018  . UTI (urinary tract infection) 02/23/2018  . Left-sided weakness 02/22/2018    Kipp Brood, PT, DPT, [facility] Physical Therapist with [facility]  06/14/2018 12:47 PM    [place], [place], [address] Phone: [phone]   Fax:  [phone]  Name: Alexis Ford MRN: [id number] Date of Birth: May 20, 1933

## 2018-06-15 DIAGNOSIS — M130 Polyarthritis, unspecified: Secondary | ICD-10-CM | POA: Diagnosis not present

## 2018-06-15 DIAGNOSIS — I952 Hypotension due to drugs: Secondary | ICD-10-CM | POA: Diagnosis not present

## 2018-06-15 DIAGNOSIS — Z6822 Body mass index (BMI) 22.0-22.9, adult: Secondary | ICD-10-CM | POA: Diagnosis not present

## 2018-06-15 DIAGNOSIS — F250 Schizoaffective disorder, bipolar type: Secondary | ICD-10-CM | POA: Diagnosis not present

## 2018-06-18 ENCOUNTER — Telehealth (HOSPITAL_COMMUNITY): Payer: Self-pay | Admitting: Physical Therapy

## 2018-06-18 ENCOUNTER — Ambulatory Visit (HOSPITAL_COMMUNITY): Payer: Medicare Other | Admitting: Physical Therapy

## 2018-06-18 NOTE — Telephone Encounter (Signed)
Called regarding patient missing appointment at 9:00 am this morning. Stated hoped all was well and reminded patient of next scheduled appointment as well as how to contact clinic if she is unable to make appointment.  Clarene Critchley PT, DPT 9:58 AM, 06/18/18 [phone]

## 2018-06-21 ENCOUNTER — Ambulatory Visit (HOSPITAL_COMMUNITY): Payer: Medicare Other

## 2018-06-21 ENCOUNTER — Telehealth (HOSPITAL_COMMUNITY): Payer: Self-pay

## 2018-06-21 NOTE — Telephone Encounter (Signed)
No Show #2: I called Alexis Ford at her home number provided and left a message to inquire bout her missed appointment. I informed her that based on our attendance policy for therapy we will have to schedule 1 appointment at a time from now on and that if she misses her next appointment without calling to cancel/re-schedule we will have to discharge from therapy and she will need to obtain a new referral. I provided our front office number in case she has any questions and reminded her her next appointment is scheduled for Wednesday 06/23/18 at 9:45 AM.  Kipp Brood, PT, DPT, [facility] Physical Therapist with [facility]  06/21/2018 10:07 AM

## 2018-06-21 NOTE — Telephone Encounter (Signed)
Patient's daughter returned our call and informed us the patient has moved back to [place] to participate in [place] physical therapy and will not be back to [place] for several months. I informed the patient's daughter we will discharge her from therapy at this time and if she wishes to return we will need a new referral.   Kipp Brood, PT, DPT, [facility] Physical Therapist with [facility]  06/21/2018 10:12 AM

## 2018-06-22 ENCOUNTER — Encounter (HOSPITAL_COMMUNITY): Payer: Self-pay

## 2018-06-22 NOTE — Therapy (Signed)
[place], [place], [address] Phone: [phone]   Fax:  [phone]  Patient Details  Name: Alexis Ford MRN: [id number] Date of Birth: 1933-12-02 Referring Provider:  No ref. provider found  Encounter Date: 06/22/2018   PHYSICAL THERAPY DISCHARGE SUMMARY  Visits from Start of Care: 1  Current functional level related to goals / functional outcomes: Alexis Ford participated in an evaluation for her Rt hip pain. She since missed her following 2 appointments and was called for each and reminded of her next appointment and our attendance policy. Alexis Ford daughter called on 06/21/2018 and informed our office her mother, Alexis Ford has moved back to [place] to live with another daughter and will receive home health PT services. She will be discharged from this episode of care.    Remaining deficits: See evaluation from 06/14/2018   Education / Equipment: Patient educated on need for imaging to Rt hip to assess joint health. Educated on plan to address strength deficits in therapy to progress mobility. Recomended following up with orthorpedic MD for bil knee pain and Rt hip limitations and pain.     Plan: Patient agrees to discharge.  Patient goals were not met. Patient is being discharged due to                                                     Pt has moved to [place] and will receive HHPT there.?????     Kipp Brood, PT, DPT, [facility] Physical Therapist with [facility]  06/22/2018 9:31 AM    [place], [place], [address] Phone: [phone]   Fax:  [phone]

## 2018-06-23 ENCOUNTER — Ambulatory Visit (HOSPITAL_COMMUNITY): Payer: Medicare Other

## 2018-06-23 DIAGNOSIS — Z0000 Encounter for general adult medical examination without abnormal findings: Secondary | ICD-10-CM | POA: Diagnosis not present

## 2018-06-28 ENCOUNTER — Encounter (HOSPITAL_COMMUNITY): Payer: Medicare Other

## 2018-07-02 ENCOUNTER — Encounter (HOSPITAL_COMMUNITY): Payer: Medicare Other

## 2018-07-03 DIAGNOSIS — M069 Rheumatoid arthritis, unspecified: Secondary | ICD-10-CM | POA: Diagnosis not present

## 2018-07-03 DIAGNOSIS — M1611 Unilateral primary osteoarthritis, right hip: Secondary | ICD-10-CM | POA: Diagnosis not present

## 2018-07-03 DIAGNOSIS — F419 Anxiety disorder, unspecified: Secondary | ICD-10-CM | POA: Diagnosis not present

## 2018-07-03 DIAGNOSIS — Z9181 History of falling: Secondary | ICD-10-CM | POA: Diagnosis not present

## 2018-07-03 DIAGNOSIS — M170 Bilateral primary osteoarthritis of knee: Secondary | ICD-10-CM | POA: Diagnosis not present

## 2018-07-03 DIAGNOSIS — F329 Major depressive disorder, single episode, unspecified: Secondary | ICD-10-CM | POA: Diagnosis not present

## 2018-07-03 DIAGNOSIS — I10 Essential (primary) hypertension: Secondary | ICD-10-CM | POA: Diagnosis not present

## 2018-07-03 DIAGNOSIS — D649 Anemia, unspecified: Secondary | ICD-10-CM | POA: Diagnosis not present

## 2018-07-03 DIAGNOSIS — I6522 Occlusion and stenosis of left carotid artery: Secondary | ICD-10-CM | POA: Diagnosis not present

## 2018-07-03 DIAGNOSIS — Z87891 Personal history of nicotine dependence: Secondary | ICD-10-CM | POA: Diagnosis not present

## 2018-07-05 ENCOUNTER — Encounter (HOSPITAL_COMMUNITY): Payer: Medicare Other

## 2018-07-05 DIAGNOSIS — M170 Bilateral primary osteoarthritis of knee: Secondary | ICD-10-CM | POA: Diagnosis not present

## 2018-07-05 DIAGNOSIS — M1611 Unilateral primary osteoarthritis, right hip: Secondary | ICD-10-CM | POA: Diagnosis not present

## 2018-07-05 DIAGNOSIS — F329 Major depressive disorder, single episode, unspecified: Secondary | ICD-10-CM | POA: Diagnosis not present

## 2018-07-05 DIAGNOSIS — M069 Rheumatoid arthritis, unspecified: Secondary | ICD-10-CM | POA: Diagnosis not present

## 2018-07-05 DIAGNOSIS — D649 Anemia, unspecified: Secondary | ICD-10-CM | POA: Diagnosis not present

## 2018-07-05 DIAGNOSIS — I10 Essential (primary) hypertension: Secondary | ICD-10-CM | POA: Diagnosis not present

## 2018-07-06 DIAGNOSIS — I10 Essential (primary) hypertension: Secondary | ICD-10-CM | POA: Diagnosis not present

## 2018-07-06 DIAGNOSIS — F329 Major depressive disorder, single episode, unspecified: Secondary | ICD-10-CM | POA: Diagnosis not present

## 2018-07-06 DIAGNOSIS — M1611 Unilateral primary osteoarthritis, right hip: Secondary | ICD-10-CM | POA: Diagnosis not present

## 2018-07-06 DIAGNOSIS — M170 Bilateral primary osteoarthritis of knee: Secondary | ICD-10-CM | POA: Diagnosis not present

## 2018-07-06 DIAGNOSIS — D649 Anemia, unspecified: Secondary | ICD-10-CM | POA: Diagnosis not present

## 2018-07-06 DIAGNOSIS — M069 Rheumatoid arthritis, unspecified: Secondary | ICD-10-CM | POA: Diagnosis not present

## 2018-07-08 DIAGNOSIS — I10 Essential (primary) hypertension: Secondary | ICD-10-CM | POA: Diagnosis not present

## 2018-07-08 DIAGNOSIS — M1611 Unilateral primary osteoarthritis, right hip: Secondary | ICD-10-CM | POA: Diagnosis not present

## 2018-07-08 DIAGNOSIS — M170 Bilateral primary osteoarthritis of knee: Secondary | ICD-10-CM | POA: Diagnosis not present

## 2018-07-08 DIAGNOSIS — D649 Anemia, unspecified: Secondary | ICD-10-CM | POA: Diagnosis not present

## 2018-07-08 DIAGNOSIS — M069 Rheumatoid arthritis, unspecified: Secondary | ICD-10-CM | POA: Diagnosis not present

## 2018-07-08 DIAGNOSIS — F329 Major depressive disorder, single episode, unspecified: Secondary | ICD-10-CM | POA: Diagnosis not present

## 2018-07-09 ENCOUNTER — Encounter (HOSPITAL_COMMUNITY): Payer: Medicare Other

## 2018-07-12 ENCOUNTER — Encounter (HOSPITAL_COMMUNITY): Payer: Medicare Other

## 2018-07-16 ENCOUNTER — Encounter (HOSPITAL_COMMUNITY): Payer: Medicare Other

## 2018-07-19 DIAGNOSIS — N390 Urinary tract infection, site not specified: Secondary | ICD-10-CM | POA: Diagnosis not present

## 2018-07-19 DIAGNOSIS — M130 Polyarthritis, unspecified: Secondary | ICD-10-CM | POA: Diagnosis not present

## 2018-07-19 DIAGNOSIS — I10 Essential (primary) hypertension: Secondary | ICD-10-CM | POA: Diagnosis not present

## 2018-07-19 DIAGNOSIS — F064 Anxiety disorder due to known physiological condition: Secondary | ICD-10-CM | POA: Diagnosis not present

## 2018-08-30 DIAGNOSIS — M130 Polyarthritis, unspecified: Secondary | ICD-10-CM | POA: Diagnosis not present

## 2018-08-30 DIAGNOSIS — R2243 Localized swelling, mass and lump, lower limb, bilateral: Secondary | ICD-10-CM | POA: Diagnosis not present

## 2018-08-30 DIAGNOSIS — F250 Schizoaffective disorder, bipolar type: Secondary | ICD-10-CM | POA: Diagnosis not present

## 2018-08-30 DIAGNOSIS — I10 Essential (primary) hypertension: Secondary | ICD-10-CM | POA: Diagnosis not present

## 2018-08-30 DIAGNOSIS — R609 Edema, unspecified: Secondary | ICD-10-CM | POA: Diagnosis not present

## 2018-09-06 DIAGNOSIS — R609 Edema, unspecified: Secondary | ICD-10-CM | POA: Diagnosis not present

## 2018-09-06 DIAGNOSIS — F064 Anxiety disorder due to known physiological condition: Secondary | ICD-10-CM | POA: Diagnosis not present

## 2018-09-06 DIAGNOSIS — M130 Polyarthritis, unspecified: Secondary | ICD-10-CM | POA: Diagnosis not present

## 2018-09-14 DIAGNOSIS — M130 Polyarthritis, unspecified: Secondary | ICD-10-CM | POA: Diagnosis not present

## 2018-09-14 DIAGNOSIS — F064 Anxiety disorder due to known physiological condition: Secondary | ICD-10-CM | POA: Diagnosis not present

## 2018-09-14 DIAGNOSIS — R609 Edema, unspecified: Secondary | ICD-10-CM | POA: Diagnosis not present

## 2018-09-16 DIAGNOSIS — R2243 Localized swelling, mass and lump, lower limb, bilateral: Secondary | ICD-10-CM | POA: Diagnosis not present

## 2018-09-16 DIAGNOSIS — I10 Essential (primary) hypertension: Secondary | ICD-10-CM | POA: Diagnosis not present

## 2018-09-16 DIAGNOSIS — M130 Polyarthritis, unspecified: Secondary | ICD-10-CM | POA: Diagnosis not present

## 2018-09-16 DIAGNOSIS — F250 Schizoaffective disorder, bipolar type: Secondary | ICD-10-CM | POA: Diagnosis not present

## 2018-09-24 DIAGNOSIS — M130 Polyarthritis, unspecified: Secondary | ICD-10-CM | POA: Diagnosis not present

## 2018-09-24 DIAGNOSIS — I10 Essential (primary) hypertension: Secondary | ICD-10-CM | POA: Diagnosis not present

## 2018-09-24 DIAGNOSIS — R600 Localized edema: Secondary | ICD-10-CM | POA: Diagnosis not present

## 2018-09-24 DIAGNOSIS — R634 Abnormal weight loss: Secondary | ICD-10-CM | POA: Diagnosis not present

## 2018-09-24 DIAGNOSIS — Z6824 Body mass index (BMI) 24.0-24.9, adult: Secondary | ICD-10-CM | POA: Diagnosis not present

## 2018-09-24 DIAGNOSIS — R609 Edema, unspecified: Secondary | ICD-10-CM | POA: Diagnosis not present

## 2018-09-25 DIAGNOSIS — I10 Essential (primary) hypertension: Secondary | ICD-10-CM | POA: Diagnosis not present

## 2018-09-25 DIAGNOSIS — F419 Anxiety disorder, unspecified: Secondary | ICD-10-CM | POA: Diagnosis not present

## 2018-09-25 DIAGNOSIS — F329 Major depressive disorder, single episode, unspecified: Secondary | ICD-10-CM | POA: Diagnosis not present

## 2018-09-25 DIAGNOSIS — M150 Primary generalized (osteo)arthritis: Secondary | ICD-10-CM | POA: Diagnosis not present

## 2018-09-25 DIAGNOSIS — F209 Schizophrenia, unspecified: Secondary | ICD-10-CM | POA: Diagnosis not present

## 2018-09-25 DIAGNOSIS — Z87891 Personal history of nicotine dependence: Secondary | ICD-10-CM | POA: Diagnosis not present

## 2018-09-25 DIAGNOSIS — R600 Localized edema: Secondary | ICD-10-CM | POA: Diagnosis not present

## 2018-10-01 DIAGNOSIS — M150 Primary generalized (osteo)arthritis: Secondary | ICD-10-CM | POA: Diagnosis not present

## 2018-10-01 DIAGNOSIS — F329 Major depressive disorder, single episode, unspecified: Secondary | ICD-10-CM | POA: Diagnosis not present

## 2018-10-01 DIAGNOSIS — R600 Localized edema: Secondary | ICD-10-CM | POA: Diagnosis not present

## 2018-10-01 DIAGNOSIS — I10 Essential (primary) hypertension: Secondary | ICD-10-CM | POA: Diagnosis not present

## 2018-10-01 DIAGNOSIS — F209 Schizophrenia, unspecified: Secondary | ICD-10-CM | POA: Diagnosis not present

## 2018-10-01 DIAGNOSIS — F419 Anxiety disorder, unspecified: Secondary | ICD-10-CM | POA: Diagnosis not present

## 2018-10-07 DIAGNOSIS — F419 Anxiety disorder, unspecified: Secondary | ICD-10-CM | POA: Diagnosis not present

## 2018-10-07 DIAGNOSIS — F209 Schizophrenia, unspecified: Secondary | ICD-10-CM | POA: Diagnosis not present

## 2018-10-07 DIAGNOSIS — R600 Localized edema: Secondary | ICD-10-CM | POA: Diagnosis not present

## 2018-10-07 DIAGNOSIS — M150 Primary generalized (osteo)arthritis: Secondary | ICD-10-CM | POA: Diagnosis not present

## 2018-10-07 DIAGNOSIS — I10 Essential (primary) hypertension: Secondary | ICD-10-CM | POA: Diagnosis not present

## 2018-10-07 DIAGNOSIS — F329 Major depressive disorder, single episode, unspecified: Secondary | ICD-10-CM | POA: Diagnosis not present

## 2018-10-08 DIAGNOSIS — F329 Major depressive disorder, single episode, unspecified: Secondary | ICD-10-CM | POA: Diagnosis not present

## 2018-10-08 DIAGNOSIS — F419 Anxiety disorder, unspecified: Secondary | ICD-10-CM | POA: Diagnosis not present

## 2018-10-08 DIAGNOSIS — F209 Schizophrenia, unspecified: Secondary | ICD-10-CM | POA: Diagnosis not present

## 2018-10-08 DIAGNOSIS — R600 Localized edema: Secondary | ICD-10-CM | POA: Diagnosis not present

## 2018-10-08 DIAGNOSIS — M150 Primary generalized (osteo)arthritis: Secondary | ICD-10-CM | POA: Diagnosis not present

## 2018-10-08 DIAGNOSIS — I10 Essential (primary) hypertension: Secondary | ICD-10-CM | POA: Diagnosis not present

## 2018-10-11 DIAGNOSIS — F329 Major depressive disorder, single episode, unspecified: Secondary | ICD-10-CM | POA: Diagnosis not present

## 2018-10-11 DIAGNOSIS — R600 Localized edema: Secondary | ICD-10-CM | POA: Diagnosis not present

## 2018-10-11 DIAGNOSIS — M150 Primary generalized (osteo)arthritis: Secondary | ICD-10-CM | POA: Diagnosis not present

## 2018-10-11 DIAGNOSIS — F209 Schizophrenia, unspecified: Secondary | ICD-10-CM | POA: Diagnosis not present

## 2018-10-11 DIAGNOSIS — F419 Anxiety disorder, unspecified: Secondary | ICD-10-CM | POA: Diagnosis not present

## 2018-10-11 DIAGNOSIS — I10 Essential (primary) hypertension: Secondary | ICD-10-CM | POA: Diagnosis not present

## 2018-10-14 ENCOUNTER — Other Ambulatory Visit: Payer: Self-pay | Admitting: Cardiology

## 2018-10-15 DIAGNOSIS — I10 Essential (primary) hypertension: Secondary | ICD-10-CM | POA: Diagnosis not present

## 2018-10-15 DIAGNOSIS — F329 Major depressive disorder, single episode, unspecified: Secondary | ICD-10-CM | POA: Diagnosis not present

## 2018-10-15 DIAGNOSIS — F419 Anxiety disorder, unspecified: Secondary | ICD-10-CM | POA: Diagnosis not present

## 2018-10-15 DIAGNOSIS — M150 Primary generalized (osteo)arthritis: Secondary | ICD-10-CM | POA: Diagnosis not present

## 2018-10-15 DIAGNOSIS — R600 Localized edema: Secondary | ICD-10-CM | POA: Diagnosis not present

## 2018-10-15 DIAGNOSIS — F209 Schizophrenia, unspecified: Secondary | ICD-10-CM | POA: Diagnosis not present

## 2018-10-17 DIAGNOSIS — R0602 Shortness of breath: Secondary | ICD-10-CM | POA: Diagnosis not present

## 2018-10-17 DIAGNOSIS — G92 Toxic encephalopathy: Secondary | ICD-10-CM | POA: Diagnosis not present

## 2018-10-17 DIAGNOSIS — I82411 Acute embolism and thrombosis of right femoral vein: Secondary | ICD-10-CM | POA: Diagnosis not present

## 2018-10-17 DIAGNOSIS — R252 Cramp and spasm: Secondary | ICD-10-CM | POA: Diagnosis not present

## 2018-10-17 DIAGNOSIS — I10 Essential (primary) hypertension: Secondary | ICD-10-CM | POA: Diagnosis not present

## 2018-10-17 DIAGNOSIS — M79604 Pain in right leg: Secondary | ICD-10-CM | POA: Diagnosis not present

## 2018-10-17 DIAGNOSIS — E876 Hypokalemia: Secondary | ICD-10-CM | POA: Diagnosis not present

## 2018-10-17 DIAGNOSIS — F0390 Unspecified dementia without behavioral disturbance: Secondary | ICD-10-CM | POA: Diagnosis not present

## 2018-10-17 DIAGNOSIS — E7220 Disorder of urea cycle metabolism, unspecified: Secondary | ICD-10-CM | POA: Diagnosis not present

## 2018-10-17 DIAGNOSIS — F17210 Nicotine dependence, cigarettes, uncomplicated: Secondary | ICD-10-CM | POA: Diagnosis not present

## 2018-10-17 DIAGNOSIS — I82401 Acute embolism and thrombosis of unspecified deep veins of right lower extremity: Secondary | ICD-10-CM | POA: Diagnosis not present

## 2018-10-17 DIAGNOSIS — R197 Diarrhea, unspecified: Secondary | ICD-10-CM | POA: Diagnosis not present

## 2018-10-18 DIAGNOSIS — M79604 Pain in right leg: Secondary | ICD-10-CM | POA: Diagnosis not present

## 2018-10-18 DIAGNOSIS — M62838 Other muscle spasm: Secondary | ICD-10-CM | POA: Diagnosis present

## 2018-10-18 DIAGNOSIS — Z1159 Encounter for screening for other viral diseases: Secondary | ICD-10-CM | POA: Diagnosis not present

## 2018-10-18 DIAGNOSIS — F17210 Nicotine dependence, cigarettes, uncomplicated: Secondary | ICD-10-CM | POA: Diagnosis present

## 2018-10-18 DIAGNOSIS — R253 Fasciculation: Secondary | ICD-10-CM | POA: Diagnosis not present

## 2018-10-18 DIAGNOSIS — B182 Chronic viral hepatitis C: Secondary | ICD-10-CM | POA: Diagnosis present

## 2018-10-18 DIAGNOSIS — E876 Hypokalemia: Secondary | ICD-10-CM | POA: Diagnosis present

## 2018-10-18 DIAGNOSIS — I82401 Acute embolism and thrombosis of unspecified deep veins of right lower extremity: Secondary | ICD-10-CM | POA: Diagnosis not present

## 2018-10-18 DIAGNOSIS — E7220 Disorder of urea cycle metabolism, unspecified: Secondary | ICD-10-CM | POA: Diagnosis not present

## 2018-10-18 DIAGNOSIS — F0390 Unspecified dementia without behavioral disturbance: Secondary | ICD-10-CM | POA: Diagnosis present

## 2018-10-18 DIAGNOSIS — D649 Anemia, unspecified: Secondary | ICD-10-CM | POA: Diagnosis present

## 2018-10-18 DIAGNOSIS — F419 Anxiety disorder, unspecified: Secondary | ICD-10-CM | POA: Diagnosis present

## 2018-10-18 DIAGNOSIS — T428X5A Adverse effect of antiparkinsonism drugs and other central muscle-tone depressants, initial encounter: Secondary | ICD-10-CM | POA: Diagnosis not present

## 2018-10-18 DIAGNOSIS — M1990 Unspecified osteoarthritis, unspecified site: Secondary | ICD-10-CM | POA: Diagnosis present

## 2018-10-18 DIAGNOSIS — K219 Gastro-esophageal reflux disease without esophagitis: Secondary | ICD-10-CM | POA: Diagnosis present

## 2018-10-18 DIAGNOSIS — N182 Chronic kidney disease, stage 2 (mild): Secondary | ICD-10-CM | POA: Diagnosis present

## 2018-10-18 DIAGNOSIS — Z79899 Other long term (current) drug therapy: Secondary | ICD-10-CM | POA: Diagnosis not present

## 2018-10-18 DIAGNOSIS — G92 Toxic encephalopathy: Secondary | ICD-10-CM | POA: Diagnosis not present

## 2018-10-18 DIAGNOSIS — R4182 Altered mental status, unspecified: Secondary | ICD-10-CM | POA: Diagnosis not present

## 2018-10-18 DIAGNOSIS — G9341 Metabolic encephalopathy: Secondary | ICD-10-CM | POA: Diagnosis not present

## 2018-10-18 DIAGNOSIS — R930 Abnormal findings on diagnostic imaging of skull and head, not elsewhere classified: Secondary | ICD-10-CM | POA: Diagnosis not present

## 2018-10-18 DIAGNOSIS — I129 Hypertensive chronic kidney disease with stage 1 through stage 4 chronic kidney disease, or unspecified chronic kidney disease: Secondary | ICD-10-CM | POA: Diagnosis present

## 2018-10-18 DIAGNOSIS — M7989 Other specified soft tissue disorders: Secondary | ICD-10-CM | POA: Diagnosis present

## 2018-10-18 DIAGNOSIS — Z0389 Encounter for observation for other suspected diseases and conditions ruled out: Secondary | ICD-10-CM | POA: Diagnosis not present

## 2018-10-18 DIAGNOSIS — F329 Major depressive disorder, single episode, unspecified: Secondary | ICD-10-CM | POA: Diagnosis present

## 2018-10-18 DIAGNOSIS — I82411 Acute embolism and thrombosis of right femoral vein: Secondary | ICD-10-CM | POA: Diagnosis present

## 2018-10-18 DIAGNOSIS — R402412 Glasgow coma scale score 13-15, at arrival to emergency department: Secondary | ICD-10-CM | POA: Diagnosis present

## 2018-10-18 DIAGNOSIS — T40605A Adverse effect of unspecified narcotics, initial encounter: Secondary | ICD-10-CM | POA: Diagnosis not present

## 2018-11-01 DIAGNOSIS — I82409 Acute embolism and thrombosis of unspecified deep veins of unspecified lower extremity: Secondary | ICD-10-CM | POA: Diagnosis not present

## 2018-11-29 ENCOUNTER — Other Ambulatory Visit: Payer: Self-pay

## 2018-12-16 DIAGNOSIS — B182 Chronic viral hepatitis C: Secondary | ICD-10-CM | POA: Diagnosis not present

## 2018-12-16 DIAGNOSIS — R2243 Localized swelling, mass and lump, lower limb, bilateral: Secondary | ICD-10-CM | POA: Diagnosis not present

## 2018-12-16 DIAGNOSIS — R2681 Unsteadiness on feet: Secondary | ICD-10-CM | POA: Diagnosis not present

## 2018-12-16 DIAGNOSIS — R600 Localized edema: Secondary | ICD-10-CM | POA: Diagnosis not present

## 2018-12-16 DIAGNOSIS — F064 Anxiety disorder due to known physiological condition: Secondary | ICD-10-CM | POA: Diagnosis not present

## 2018-12-16 DIAGNOSIS — M130 Polyarthritis, unspecified: Secondary | ICD-10-CM | POA: Diagnosis not present

## 2018-12-16 DIAGNOSIS — K5909 Other constipation: Secondary | ICD-10-CM | POA: Diagnosis not present

## 2018-12-16 DIAGNOSIS — E876 Hypokalemia: Secondary | ICD-10-CM | POA: Diagnosis not present

## 2018-12-16 DIAGNOSIS — I10 Essential (primary) hypertension: Secondary | ICD-10-CM | POA: Diagnosis not present

## 2018-12-16 DIAGNOSIS — I82529 Chronic embolism and thrombosis of unspecified iliac vein: Secondary | ICD-10-CM | POA: Diagnosis not present

## 2018-12-16 DIAGNOSIS — N2589 Other disorders resulting from impaired renal tubular function: Secondary | ICD-10-CM | POA: Diagnosis not present

## 2018-12-16 DIAGNOSIS — B162 Acute hepatitis B without delta-agent with hepatic coma: Secondary | ICD-10-CM | POA: Diagnosis not present

## 2018-12-16 DIAGNOSIS — R069 Unspecified abnormalities of breathing: Secondary | ICD-10-CM | POA: Diagnosis not present

## 2018-12-16 DIAGNOSIS — F250 Schizoaffective disorder, bipolar type: Secondary | ICD-10-CM | POA: Diagnosis not present

## 2018-12-27 DIAGNOSIS — R079 Chest pain, unspecified: Secondary | ICD-10-CM | POA: Diagnosis not present

## 2018-12-27 DIAGNOSIS — R531 Weakness: Secondary | ICD-10-CM | POA: Diagnosis not present

## 2018-12-27 DIAGNOSIS — N644 Mastodynia: Secondary | ICD-10-CM | POA: Diagnosis not present

## 2018-12-27 DIAGNOSIS — M7989 Other specified soft tissue disorders: Secondary | ICD-10-CM | POA: Diagnosis not present

## 2018-12-27 DIAGNOSIS — M791 Myalgia, unspecified site: Secondary | ICD-10-CM | POA: Diagnosis not present

## 2018-12-27 DIAGNOSIS — N61 Mastitis without abscess: Secondary | ICD-10-CM | POA: Diagnosis not present

## 2018-12-27 DIAGNOSIS — F17210 Nicotine dependence, cigarettes, uncomplicated: Secondary | ICD-10-CM | POA: Diagnosis not present

## 2018-12-30 DIAGNOSIS — R600 Localized edema: Secondary | ICD-10-CM | POA: Diagnosis not present

## 2019-02-04 DIAGNOSIS — R600 Localized edema: Secondary | ICD-10-CM | POA: Diagnosis not present

## 2019-02-14 DIAGNOSIS — K589 Irritable bowel syndrome without diarrhea: Secondary | ICD-10-CM | POA: Diagnosis not present

## 2019-02-18 DIAGNOSIS — R29898 Other symptoms and signs involving the musculoskeletal system: Secondary | ICD-10-CM | POA: Diagnosis not present

## 2019-02-18 DIAGNOSIS — M4802 Spinal stenosis, cervical region: Secondary | ICD-10-CM | POA: Diagnosis not present

## 2019-02-18 DIAGNOSIS — G8929 Other chronic pain: Secondary | ICD-10-CM | POA: Diagnosis not present

## 2019-02-18 DIAGNOSIS — Z8673 Personal history of transient ischemic attack (TIA), and cerebral infarction without residual deficits: Secondary | ICD-10-CM | POA: Diagnosis not present

## 2019-02-18 DIAGNOSIS — F419 Anxiety disorder, unspecified: Secondary | ICD-10-CM | POA: Diagnosis not present

## 2019-02-18 DIAGNOSIS — Z7901 Long term (current) use of anticoagulants: Secondary | ICD-10-CM | POA: Diagnosis not present

## 2019-02-18 DIAGNOSIS — K219 Gastro-esophageal reflux disease without esophagitis: Secondary | ICD-10-CM | POA: Diagnosis not present

## 2019-02-18 DIAGNOSIS — M1611 Unilateral primary osteoarthritis, right hip: Secondary | ICD-10-CM | POA: Diagnosis not present

## 2019-02-18 DIAGNOSIS — Z79899 Other long term (current) drug therapy: Secondary | ICD-10-CM | POA: Diagnosis not present

## 2019-02-18 DIAGNOSIS — R079 Chest pain, unspecified: Secondary | ICD-10-CM | POA: Diagnosis not present

## 2019-02-18 DIAGNOSIS — M5030 Other cervical disc degeneration, unspecified cervical region: Secondary | ICD-10-CM | POA: Diagnosis not present

## 2019-02-18 DIAGNOSIS — F329 Major depressive disorder, single episode, unspecified: Secondary | ICD-10-CM | POA: Diagnosis not present

## 2019-02-18 DIAGNOSIS — M21331 Wrist drop, right wrist: Secondary | ICD-10-CM | POA: Diagnosis not present

## 2019-02-18 DIAGNOSIS — G5631 Lesion of radial nerve, right upper limb: Secondary | ICD-10-CM | POA: Diagnosis not present

## 2019-02-18 DIAGNOSIS — G319 Degenerative disease of nervous system, unspecified: Secondary | ICD-10-CM | POA: Diagnosis not present

## 2019-02-18 DIAGNOSIS — R531 Weakness: Secondary | ICD-10-CM | POA: Diagnosis not present

## 2019-02-18 DIAGNOSIS — J42 Unspecified chronic bronchitis: Secondary | ICD-10-CM | POA: Diagnosis not present

## 2019-02-18 DIAGNOSIS — R202 Paresthesia of skin: Secondary | ICD-10-CM | POA: Diagnosis not present

## 2019-02-18 DIAGNOSIS — Z86718 Personal history of other venous thrombosis and embolism: Secondary | ICD-10-CM | POA: Diagnosis not present

## 2019-02-18 DIAGNOSIS — F17200 Nicotine dependence, unspecified, uncomplicated: Secondary | ICD-10-CM | POA: Diagnosis not present

## 2019-02-18 DIAGNOSIS — R197 Diarrhea, unspecified: Secondary | ICD-10-CM | POA: Diagnosis not present

## 2019-02-18 DIAGNOSIS — R29818 Other symptoms and signs involving the nervous system: Secondary | ICD-10-CM | POA: Diagnosis not present

## 2019-02-18 DIAGNOSIS — R200 Anesthesia of skin: Secondary | ICD-10-CM | POA: Diagnosis not present

## 2019-02-18 DIAGNOSIS — E785 Hyperlipidemia, unspecified: Secondary | ICD-10-CM | POA: Diagnosis not present

## 2019-02-18 DIAGNOSIS — E876 Hypokalemia: Secondary | ICD-10-CM | POA: Diagnosis not present

## 2019-02-18 DIAGNOSIS — D649 Anemia, unspecified: Secondary | ICD-10-CM | POA: Diagnosis not present

## 2019-02-18 DIAGNOSIS — F17210 Nicotine dependence, cigarettes, uncomplicated: Secondary | ICD-10-CM | POA: Diagnosis not present

## 2019-02-18 DIAGNOSIS — I10 Essential (primary) hypertension: Secondary | ICD-10-CM | POA: Diagnosis not present

## 2019-02-18 DIAGNOSIS — Z741 Need for assistance with personal care: Secondary | ICD-10-CM | POA: Diagnosis not present

## 2019-02-18 DIAGNOSIS — B182 Chronic viral hepatitis C: Secondary | ICD-10-CM | POA: Diagnosis not present

## 2019-02-19 DIAGNOSIS — F17200 Nicotine dependence, unspecified, uncomplicated: Secondary | ICD-10-CM | POA: Diagnosis not present

## 2019-02-19 DIAGNOSIS — M5030 Other cervical disc degeneration, unspecified cervical region: Secondary | ICD-10-CM | POA: Diagnosis not present

## 2019-02-19 DIAGNOSIS — Z7901 Long term (current) use of anticoagulants: Secondary | ICD-10-CM | POA: Diagnosis not present

## 2019-02-19 DIAGNOSIS — R531 Weakness: Secondary | ICD-10-CM | POA: Diagnosis not present

## 2019-02-19 DIAGNOSIS — M4802 Spinal stenosis, cervical region: Secondary | ICD-10-CM | POA: Diagnosis not present

## 2019-02-19 DIAGNOSIS — G319 Degenerative disease of nervous system, unspecified: Secondary | ICD-10-CM | POA: Diagnosis not present

## 2019-02-19 DIAGNOSIS — G5631 Lesion of radial nerve, right upper limb: Secondary | ICD-10-CM | POA: Diagnosis not present

## 2019-02-19 DIAGNOSIS — E785 Hyperlipidemia, unspecified: Secondary | ICD-10-CM | POA: Diagnosis not present

## 2019-02-20 DIAGNOSIS — G5631 Lesion of radial nerve, right upper limb: Secondary | ICD-10-CM | POA: Diagnosis not present

## 2019-02-20 DIAGNOSIS — Z7901 Long term (current) use of anticoagulants: Secondary | ICD-10-CM | POA: Diagnosis not present

## 2019-02-20 DIAGNOSIS — F17200 Nicotine dependence, unspecified, uncomplicated: Secondary | ICD-10-CM | POA: Diagnosis not present

## 2019-02-20 DIAGNOSIS — I639 Cerebral infarction, unspecified: Secondary | ICD-10-CM | POA: Diagnosis not present

## 2019-02-20 DIAGNOSIS — I348 Other nonrheumatic mitral valve disorders: Secondary | ICD-10-CM | POA: Diagnosis not present

## 2019-02-20 DIAGNOSIS — E785 Hyperlipidemia, unspecified: Secondary | ICD-10-CM | POA: Diagnosis not present

## 2019-02-20 DIAGNOSIS — R531 Weakness: Secondary | ICD-10-CM | POA: Diagnosis not present

## 2019-02-28 DIAGNOSIS — R609 Edema, unspecified: Secondary | ICD-10-CM | POA: Diagnosis not present

## 2019-02-28 DIAGNOSIS — Z23 Encounter for immunization: Secondary | ICD-10-CM | POA: Diagnosis not present

## 2019-02-28 DIAGNOSIS — N61 Mastitis without abscess: Secondary | ICD-10-CM | POA: Diagnosis not present

## 2019-03-08 DIAGNOSIS — M25551 Pain in right hip: Secondary | ICD-10-CM | POA: Diagnosis not present

## 2019-03-08 DIAGNOSIS — M25562 Pain in left knee: Secondary | ICD-10-CM | POA: Diagnosis not present

## 2019-03-08 DIAGNOSIS — M25561 Pain in right knee: Secondary | ICD-10-CM | POA: Diagnosis not present

## 2019-04-05 DIAGNOSIS — F339 Major depressive disorder, recurrent, unspecified: Secondary | ICD-10-CM | POA: Diagnosis not present

## 2019-05-09 ENCOUNTER — Other Ambulatory Visit: Payer: Self-pay | Admitting: *Deleted

## 2019-05-09 MED ORDER — ATORVASTATIN CALCIUM 10 MG PO TABS: ORAL_TABLET | ORAL | 0 refills | Status: DC

## 2019-05-25 DIAGNOSIS — D649 Anemia, unspecified: Secondary | ICD-10-CM | POA: Diagnosis not present

## 2019-05-25 DIAGNOSIS — F250 Schizoaffective disorder, bipolar type: Secondary | ICD-10-CM | POA: Diagnosis not present

## 2019-05-25 DIAGNOSIS — R634 Abnormal weight loss: Secondary | ICD-10-CM | POA: Diagnosis not present

## 2019-05-25 DIAGNOSIS — R2681 Unsteadiness on feet: Secondary | ICD-10-CM | POA: Diagnosis not present

## 2019-05-25 DIAGNOSIS — I10 Essential (primary) hypertension: Secondary | ICD-10-CM | POA: Diagnosis not present

## 2019-05-25 DIAGNOSIS — Z6822 Body mass index (BMI) 22.0-22.9, adult: Secondary | ICD-10-CM | POA: Diagnosis not present

## 2019-06-05 ENCOUNTER — Other Ambulatory Visit: Payer: Self-pay | Admitting: Cardiology

## 2019-08-11 DIAGNOSIS — F250 Schizoaffective disorder, bipolar type: Secondary | ICD-10-CM | POA: Diagnosis not present

## 2019-08-11 DIAGNOSIS — D530 Protein deficiency anemia: Secondary | ICD-10-CM | POA: Diagnosis not present

## 2019-08-11 DIAGNOSIS — I10 Essential (primary) hypertension: Secondary | ICD-10-CM | POA: Diagnosis not present

## 2019-08-11 DIAGNOSIS — R609 Edema, unspecified: Secondary | ICD-10-CM | POA: Diagnosis not present

## 2019-08-12 DIAGNOSIS — Z86711 Personal history of pulmonary embolism: Secondary | ICD-10-CM | POA: Diagnosis not present

## 2019-08-12 DIAGNOSIS — Z7901 Long term (current) use of anticoagulants: Secondary | ICD-10-CM | POA: Diagnosis not present

## 2019-08-12 DIAGNOSIS — M79609 Pain in unspecified limb: Secondary | ICD-10-CM | POA: Diagnosis not present

## 2019-08-12 DIAGNOSIS — I10 Essential (primary) hypertension: Secondary | ICD-10-CM | POA: Diagnosis not present

## 2019-08-12 DIAGNOSIS — R5383 Other fatigue: Secondary | ICD-10-CM | POA: Diagnosis not present

## 2019-08-12 DIAGNOSIS — Z79899 Other long term (current) drug therapy: Secondary | ICD-10-CM | POA: Diagnosis not present

## 2019-08-12 DIAGNOSIS — B182 Chronic viral hepatitis C: Secondary | ICD-10-CM | POA: Diagnosis not present

## 2019-08-12 DIAGNOSIS — M79601 Pain in right arm: Secondary | ICD-10-CM | POA: Diagnosis not present

## 2019-08-12 DIAGNOSIS — R531 Weakness: Secondary | ICD-10-CM | POA: Diagnosis not present

## 2019-08-12 DIAGNOSIS — M79602 Pain in left arm: Secondary | ICD-10-CM | POA: Diagnosis not present

## 2019-08-12 DIAGNOSIS — R4182 Altered mental status, unspecified: Secondary | ICD-10-CM | POA: Diagnosis not present

## 2019-08-12 DIAGNOSIS — F17210 Nicotine dependence, cigarettes, uncomplicated: Secondary | ICD-10-CM | POA: Diagnosis not present

## 2019-08-18 DIAGNOSIS — Z23 Encounter for immunization: Secondary | ICD-10-CM | POA: Diagnosis not present

## 2019-09-01 DIAGNOSIS — M24572 Contracture, left ankle: Secondary | ICD-10-CM | POA: Diagnosis not present

## 2019-09-01 DIAGNOSIS — M79671 Pain in right foot: Secondary | ICD-10-CM | POA: Diagnosis not present

## 2019-09-01 DIAGNOSIS — M79672 Pain in left foot: Secondary | ICD-10-CM | POA: Diagnosis not present

## 2019-09-05 DIAGNOSIS — R5383 Other fatigue: Secondary | ICD-10-CM | POA: Diagnosis not present

## 2019-09-08 DIAGNOSIS — Z23 Encounter for immunization: Secondary | ICD-10-CM | POA: Diagnosis not present

## 2019-09-13 DIAGNOSIS — K224 Dyskinesia of esophagus: Secondary | ICD-10-CM | POA: Diagnosis not present

## 2019-09-13 DIAGNOSIS — R1310 Dysphagia, unspecified: Secondary | ICD-10-CM | POA: Diagnosis not present

## 2019-09-16 DIAGNOSIS — M545 Low back pain: Secondary | ICD-10-CM | POA: Diagnosis not present

## 2019-09-25 DIAGNOSIS — M1611 Unilateral primary osteoarthritis, right hip: Secondary | ICD-10-CM | POA: Diagnosis not present

## 2019-09-25 DIAGNOSIS — R600 Localized edema: Secondary | ICD-10-CM | POA: Diagnosis not present

## 2019-09-25 DIAGNOSIS — M545 Low back pain: Secondary | ICD-10-CM | POA: Diagnosis not present

## 2019-09-25 DIAGNOSIS — R937 Abnormal findings on diagnostic imaging of other parts of musculoskeletal system: Secondary | ICD-10-CM | POA: Diagnosis not present

## 2019-10-06 DIAGNOSIS — M79672 Pain in left foot: Secondary | ICD-10-CM | POA: Diagnosis not present

## 2019-10-06 DIAGNOSIS — L859 Epidermal thickening, unspecified: Secondary | ICD-10-CM | POA: Diagnosis not present

## 2019-10-06 DIAGNOSIS — E1159 Type 2 diabetes mellitus with other circulatory complications: Secondary | ICD-10-CM | POA: Diagnosis not present

## 2019-10-06 DIAGNOSIS — B351 Tinea unguium: Secondary | ICD-10-CM | POA: Diagnosis not present

## 2019-11-08 DIAGNOSIS — I10 Essential (primary) hypertension: Secondary | ICD-10-CM | POA: Diagnosis not present

## 2019-11-08 DIAGNOSIS — Z7901 Long term (current) use of anticoagulants: Secondary | ICD-10-CM | POA: Diagnosis not present

## 2019-11-08 DIAGNOSIS — Z993 Dependence on wheelchair: Secondary | ICD-10-CM | POA: Diagnosis not present

## 2019-11-08 DIAGNOSIS — F259 Schizoaffective disorder, unspecified: Secondary | ICD-10-CM | POA: Diagnosis not present

## 2019-11-08 DIAGNOSIS — Z86711 Personal history of pulmonary embolism: Secondary | ICD-10-CM | POA: Diagnosis not present

## 2019-11-08 DIAGNOSIS — B182 Chronic viral hepatitis C: Secondary | ICD-10-CM | POA: Diagnosis not present

## 2019-11-08 DIAGNOSIS — D649 Anemia, unspecified: Secondary | ICD-10-CM | POA: Diagnosis not present

## 2019-11-08 DIAGNOSIS — Z6824 Body mass index (BMI) 24.0-24.9, adult: Secondary | ICD-10-CM | POA: Diagnosis not present

## 2019-11-08 DIAGNOSIS — E669 Obesity, unspecified: Secondary | ICD-10-CM | POA: Diagnosis not present

## 2019-11-08 DIAGNOSIS — F17210 Nicotine dependence, cigarettes, uncomplicated: Secondary | ICD-10-CM | POA: Diagnosis not present

## 2019-11-08 DIAGNOSIS — Z7951 Long term (current) use of inhaled steroids: Secondary | ICD-10-CM | POA: Diagnosis not present

## 2019-11-08 DIAGNOSIS — E46 Unspecified protein-calorie malnutrition: Secondary | ICD-10-CM | POA: Diagnosis not present

## 2019-11-09 DIAGNOSIS — B182 Chronic viral hepatitis C: Secondary | ICD-10-CM | POA: Diagnosis not present

## 2019-11-09 DIAGNOSIS — F17210 Nicotine dependence, cigarettes, uncomplicated: Secondary | ICD-10-CM | POA: Diagnosis not present

## 2019-11-09 DIAGNOSIS — E46 Unspecified protein-calorie malnutrition: Secondary | ICD-10-CM | POA: Diagnosis not present

## 2019-11-09 DIAGNOSIS — F259 Schizoaffective disorder, unspecified: Secondary | ICD-10-CM | POA: Diagnosis not present

## 2019-11-09 DIAGNOSIS — D649 Anemia, unspecified: Secondary | ICD-10-CM | POA: Diagnosis not present

## 2019-11-09 DIAGNOSIS — I10 Essential (primary) hypertension: Secondary | ICD-10-CM | POA: Diagnosis not present

## 2019-11-14 DIAGNOSIS — F17210 Nicotine dependence, cigarettes, uncomplicated: Secondary | ICD-10-CM | POA: Diagnosis not present

## 2019-11-14 DIAGNOSIS — D649 Anemia, unspecified: Secondary | ICD-10-CM | POA: Diagnosis not present

## 2019-11-14 DIAGNOSIS — E46 Unspecified protein-calorie malnutrition: Secondary | ICD-10-CM | POA: Diagnosis not present

## 2019-11-14 DIAGNOSIS — I10 Essential (primary) hypertension: Secondary | ICD-10-CM | POA: Diagnosis not present

## 2019-11-14 DIAGNOSIS — B182 Chronic viral hepatitis C: Secondary | ICD-10-CM | POA: Diagnosis not present

## 2019-11-14 DIAGNOSIS — F259 Schizoaffective disorder, unspecified: Secondary | ICD-10-CM | POA: Diagnosis not present

## 2019-11-15 DIAGNOSIS — R638 Other symptoms and signs concerning food and fluid intake: Secondary | ICD-10-CM | POA: Diagnosis not present

## 2019-11-15 DIAGNOSIS — J9601 Acute respiratory failure with hypoxia: Secondary | ICD-10-CM | POA: Diagnosis not present

## 2019-11-15 DIAGNOSIS — G9340 Encephalopathy, unspecified: Secondary | ICD-10-CM | POA: Diagnosis not present

## 2019-11-15 DIAGNOSIS — Z86711 Personal history of pulmonary embolism: Secondary | ICD-10-CM | POA: Diagnosis not present

## 2019-11-15 DIAGNOSIS — B182 Chronic viral hepatitis C: Secondary | ICD-10-CM | POA: Diagnosis present

## 2019-11-15 DIAGNOSIS — D696 Thrombocytopenia, unspecified: Secondary | ICD-10-CM | POA: Diagnosis present

## 2019-11-15 DIAGNOSIS — R1310 Dysphagia, unspecified: Secondary | ICD-10-CM | POA: Diagnosis present

## 2019-11-15 DIAGNOSIS — R4182 Altered mental status, unspecified: Secondary | ICD-10-CM | POA: Diagnosis not present

## 2019-11-15 DIAGNOSIS — A419 Sepsis, unspecified organism: Secondary | ICD-10-CM | POA: Diagnosis present

## 2019-11-15 DIAGNOSIS — I959 Hypotension, unspecified: Secondary | ICD-10-CM | POA: Diagnosis not present

## 2019-11-15 DIAGNOSIS — K7460 Unspecified cirrhosis of liver: Secondary | ICD-10-CM | POA: Diagnosis present

## 2019-11-15 DIAGNOSIS — I10 Essential (primary) hypertension: Secondary | ICD-10-CM | POA: Diagnosis present

## 2019-11-15 DIAGNOSIS — N179 Acute kidney failure, unspecified: Secondary | ICD-10-CM | POA: Diagnosis not present

## 2019-11-15 DIAGNOSIS — J90 Pleural effusion, not elsewhere classified: Secondary | ICD-10-CM | POA: Diagnosis not present

## 2019-11-15 DIAGNOSIS — R509 Fever, unspecified: Secondary | ICD-10-CM | POA: Diagnosis not present

## 2019-11-15 DIAGNOSIS — B1920 Unspecified viral hepatitis C without hepatic coma: Secondary | ICD-10-CM | POA: Diagnosis not present

## 2019-11-15 DIAGNOSIS — Z452 Encounter for adjustment and management of vascular access device: Secondary | ICD-10-CM | POA: Diagnosis not present

## 2019-11-15 DIAGNOSIS — R188 Other ascites: Secondary | ICD-10-CM | POA: Diagnosis not present

## 2019-11-15 DIAGNOSIS — Z4682 Encounter for fitting and adjustment of non-vascular catheter: Secondary | ICD-10-CM | POA: Diagnosis not present

## 2019-11-15 DIAGNOSIS — Z7401 Bed confinement status: Secondary | ICD-10-CM | POA: Diagnosis not present

## 2019-11-15 DIAGNOSIS — Z9981 Dependence on supplemental oxygen: Secondary | ICD-10-CM | POA: Diagnosis not present

## 2019-11-15 DIAGNOSIS — E8770 Fluid overload, unspecified: Secondary | ICD-10-CM | POA: Diagnosis present

## 2019-11-15 DIAGNOSIS — Z7901 Long term (current) use of anticoagulants: Secondary | ICD-10-CM | POA: Diagnosis not present

## 2019-11-15 DIAGNOSIS — N390 Urinary tract infection, site not specified: Secondary | ICD-10-CM | POA: Diagnosis not present

## 2019-11-15 DIAGNOSIS — K8020 Calculus of gallbladder without cholecystitis without obstruction: Secondary | ICD-10-CM | POA: Diagnosis not present

## 2019-11-15 DIAGNOSIS — I361 Nonrheumatic tricuspid (valve) insufficiency: Secondary | ICD-10-CM | POA: Diagnosis not present

## 2019-11-15 DIAGNOSIS — Z515 Encounter for palliative care: Secondary | ICD-10-CM | POA: Diagnosis not present

## 2019-11-15 DIAGNOSIS — R350 Frequency of micturition: Secondary | ICD-10-CM | POA: Diagnosis not present

## 2019-11-15 DIAGNOSIS — J690 Pneumonitis due to inhalation of food and vomit: Secondary | ICD-10-CM | POA: Diagnosis present

## 2019-11-15 DIAGNOSIS — D649 Anemia, unspecified: Secondary | ICD-10-CM | POA: Diagnosis not present

## 2019-11-15 DIAGNOSIS — Z86718 Personal history of other venous thrombosis and embolism: Secondary | ICD-10-CM | POA: Diagnosis not present

## 2019-11-15 DIAGNOSIS — Z8673 Personal history of transient ischemic attack (TIA), and cerebral infarction without residual deficits: Secondary | ICD-10-CM | POA: Diagnosis not present

## 2019-11-15 DIAGNOSIS — E43 Unspecified severe protein-calorie malnutrition: Secondary | ICD-10-CM | POA: Diagnosis present

## 2019-11-15 DIAGNOSIS — F17210 Nicotine dependence, cigarettes, uncomplicated: Secondary | ICD-10-CM | POA: Diagnosis present

## 2019-11-15 DIAGNOSIS — Z7189 Other specified counseling: Secondary | ICD-10-CM | POA: Diagnosis not present

## 2019-11-15 DIAGNOSIS — G9341 Metabolic encephalopathy: Secondary | ICD-10-CM | POA: Diagnosis present

## 2019-11-15 DIAGNOSIS — J9690 Respiratory failure, unspecified, unspecified whether with hypoxia or hypercapnia: Secondary | ICD-10-CM | POA: Diagnosis not present

## 2019-11-15 DIAGNOSIS — R6520 Severe sepsis without septic shock: Secondary | ICD-10-CM | POA: Diagnosis present

## 2019-11-15 DIAGNOSIS — J189 Pneumonia, unspecified organism: Secondary | ICD-10-CM | POA: Diagnosis not present

## 2019-11-15 DIAGNOSIS — Z66 Do not resuscitate: Secondary | ICD-10-CM | POA: Diagnosis present

## 2019-11-15 DIAGNOSIS — R410 Disorientation, unspecified: Secondary | ICD-10-CM | POA: Diagnosis not present

## 2019-11-15 DIAGNOSIS — J9600 Acute respiratory failure, unspecified whether with hypoxia or hypercapnia: Secondary | ICD-10-CM | POA: Diagnosis present

## 2019-11-27 DEATH — deceased
